# Patient Record
Sex: Male | Born: 1945 | Race: Black or African American | Hispanic: No | Marital: Married | Smoking: Former smoker
Health system: Southern US, Community
[De-identification: ages and names within clinical notes are randomized; demographics above are authoritative.]

## PROBLEM LIST (undated history)

## (undated) DIAGNOSIS — E785 Hyperlipidemia, unspecified: Secondary | ICD-10-CM

## (undated) DIAGNOSIS — R451 Restlessness and agitation: Secondary | ICD-10-CM

## (undated) DIAGNOSIS — C3490 Malignant neoplasm of unspecified part of unspecified bronchus or lung: Secondary | ICD-10-CM

## (undated) DIAGNOSIS — F4310 Post-traumatic stress disorder, unspecified: Secondary | ICD-10-CM

## (undated) HISTORY — PX: COLONOSCOPY: SHX174

---

## 2005-04-03 ENCOUNTER — Ambulatory Visit (HOSPITAL_COMMUNITY): Admission: RE | Admit: 2005-04-03 | Discharge: 2005-04-03 | Payer: Self-pay | Admitting: Gastroenterology

## 2013-09-07 ENCOUNTER — Other Ambulatory Visit: Payer: Self-pay | Admitting: Internal Medicine

## 2013-09-07 ENCOUNTER — Ambulatory Visit
Admission: RE | Admit: 2013-09-07 | Discharge: 2013-09-07 | Disposition: A | Discharge status: Home / Self Care | Payer: Medicare Other | Source: Ambulatory Visit | Attending: Internal Medicine | Admitting: Internal Medicine

## 2013-09-07 DIAGNOSIS — R059 Cough, unspecified: Secondary | ICD-10-CM

## 2013-09-07 DIAGNOSIS — R05 Cough: Secondary | ICD-10-CM

## 2013-09-07 DIAGNOSIS — R918 Other nonspecific abnormal finding of lung field: Secondary | ICD-10-CM

## 2013-09-10 ENCOUNTER — Ambulatory Visit
Admission: RE | Admit: 2013-09-10 | Discharge: 2013-09-10 | Disposition: A | Discharge status: Home / Self Care | Payer: Medicare Other | Source: Ambulatory Visit | Attending: Internal Medicine | Admitting: Internal Medicine

## 2013-09-10 DIAGNOSIS — R059 Cough, unspecified: Secondary | ICD-10-CM

## 2013-09-10 DIAGNOSIS — R05 Cough: Secondary | ICD-10-CM

## 2013-09-10 DIAGNOSIS — R918 Other nonspecific abnormal finding of lung field: Secondary | ICD-10-CM

## 2013-09-10 MED ORDER — IOHEXOL 300 MG/ML  SOLN
75.0000 mL | Freq: Once | INTRAMUSCULAR | Status: AC | PRN
  Administered 2013-09-10: 75 mL via INTRAVENOUS

## 2015-08-14 ENCOUNTER — Inpatient Hospital Stay (HOSPITAL_COMMUNITY): Payer: Medicare Other

## 2015-08-14 ENCOUNTER — Emergency Department (HOSPITAL_COMMUNITY): Payer: Medicare Other

## 2015-08-14 ENCOUNTER — Encounter (HOSPITAL_COMMUNITY): Payer: Self-pay | Admitting: Neurology

## 2015-08-14 ENCOUNTER — Inpatient Hospital Stay (HOSPITAL_COMMUNITY)
Admission: EM | Admit: 2015-08-14 | Discharge: 2015-09-13 | DRG: 208 | Disposition: E | Payer: Medicare Other | Attending: Internal Medicine | Admitting: Internal Medicine

## 2015-08-14 DIAGNOSIS — R569 Unspecified convulsions: Secondary | ICD-10-CM | POA: Diagnosis not present

## 2015-08-14 DIAGNOSIS — R001 Bradycardia, unspecified: Secondary | ICD-10-CM | POA: Diagnosis not present

## 2015-08-14 DIAGNOSIS — Z87891 Personal history of nicotine dependence: Secondary | ICD-10-CM

## 2015-08-14 DIAGNOSIS — C3490 Malignant neoplasm of unspecified part of unspecified bronchus or lung: Secondary | ICD-10-CM | POA: Diagnosis present

## 2015-08-14 DIAGNOSIS — Z515 Encounter for palliative care: Secondary | ICD-10-CM | POA: Diagnosis present

## 2015-08-14 DIAGNOSIS — H0520 Unspecified exophthalmos: Secondary | ICD-10-CM

## 2015-08-14 DIAGNOSIS — G4089 Other seizures: Secondary | ICD-10-CM | POA: Diagnosis present

## 2015-08-14 DIAGNOSIS — I10 Essential (primary) hypertension: Secondary | ICD-10-CM | POA: Diagnosis present

## 2015-08-14 DIAGNOSIS — F4310 Post-traumatic stress disorder, unspecified: Secondary | ICD-10-CM | POA: Diagnosis present

## 2015-08-14 DIAGNOSIS — N179 Acute kidney failure, unspecified: Secondary | ICD-10-CM | POA: Diagnosis present

## 2015-08-14 DIAGNOSIS — J9600 Acute respiratory failure, unspecified whether with hypoxia or hypercapnia: Secondary | ICD-10-CM | POA: Diagnosis present

## 2015-08-14 DIAGNOSIS — J9601 Acute respiratory failure with hypoxia: Secondary | ICD-10-CM | POA: Diagnosis not present

## 2015-08-14 DIAGNOSIS — E869 Volume depletion, unspecified: Secondary | ICD-10-CM | POA: Diagnosis present

## 2015-08-14 DIAGNOSIS — T41295A Adverse effect of other general anesthetics, initial encounter: Secondary | ICD-10-CM | POA: Diagnosis not present

## 2015-08-14 DIAGNOSIS — Z978 Presence of other specified devices: Secondary | ICD-10-CM | POA: Insufficient documentation

## 2015-08-14 DIAGNOSIS — G9340 Encephalopathy, unspecified: Secondary | ICD-10-CM | POA: Diagnosis present

## 2015-08-14 DIAGNOSIS — E870 Hyperosmolality and hypernatremia: Secondary | ICD-10-CM | POA: Diagnosis present

## 2015-08-14 DIAGNOSIS — I639 Cerebral infarction, unspecified: Secondary | ICD-10-CM

## 2015-08-14 DIAGNOSIS — Z66 Do not resuscitate: Secondary | ICD-10-CM | POA: Diagnosis present

## 2015-08-14 DIAGNOSIS — J9690 Respiratory failure, unspecified, unspecified whether with hypoxia or hypercapnia: Secondary | ICD-10-CM | POA: Diagnosis present

## 2015-08-14 DIAGNOSIS — Z4659 Encounter for fitting and adjustment of other gastrointestinal appliance and device: Secondary | ICD-10-CM | POA: Insufficient documentation

## 2015-08-14 DIAGNOSIS — C7949 Secondary malignant neoplasm of other parts of nervous system: Secondary | ICD-10-CM | POA: Diagnosis not present

## 2015-08-14 DIAGNOSIS — E876 Hypokalemia: Secondary | ICD-10-CM | POA: Diagnosis present

## 2015-08-14 DIAGNOSIS — E785 Hyperlipidemia, unspecified: Secondary | ICD-10-CM | POA: Diagnosis present

## 2015-08-14 DIAGNOSIS — R404 Transient alteration of awareness: Secondary | ICD-10-CM | POA: Diagnosis not present

## 2015-08-14 DIAGNOSIS — R9402 Abnormal brain scan: Secondary | ICD-10-CM | POA: Diagnosis not present

## 2015-08-14 DIAGNOSIS — C7931 Secondary malignant neoplasm of brain: Secondary | ICD-10-CM | POA: Diagnosis present

## 2015-08-14 DIAGNOSIS — Z789 Other specified health status: Secondary | ICD-10-CM | POA: Diagnosis not present

## 2015-08-14 DIAGNOSIS — G40301 Generalized idiopathic epilepsy and epileptic syndromes, not intractable, with status epilepticus: Secondary | ICD-10-CM | POA: Diagnosis not present

## 2015-08-14 DIAGNOSIS — R451 Restlessness and agitation: Secondary | ICD-10-CM | POA: Diagnosis not present

## 2015-08-14 HISTORY — DX: Restlessness and agitation: R45.1

## 2015-08-14 HISTORY — DX: Hyperlipidemia, unspecified: E78.5

## 2015-08-14 HISTORY — DX: Post-traumatic stress disorder, unspecified: F43.10

## 2015-08-14 HISTORY — DX: Malignant neoplasm of unspecified part of unspecified bronchus or lung: C34.90

## 2015-08-14 LAB — CBC
HCT: 42 % (ref 39.0–52.0)
HEMOGLOBIN: 14 g/dL (ref 13.0–17.0)
MCH: 28 pg (ref 26.0–34.0)
MCHC: 33.3 g/dL (ref 30.0–36.0)
MCV: 84 fL (ref 78.0–100.0)
PLATELETS: 233 10*3/uL (ref 150–400)
RBC: 5 MIL/uL (ref 4.22–5.81)
RDW: 15.3 % (ref 11.5–15.5)
WBC: 8.8 10*3/uL (ref 4.0–10.5)

## 2015-08-14 LAB — BLOOD GAS, ARTERIAL
ACID-BASE EXCESS: 2.1 mmol/L — AB (ref 0.0–2.0)
BICARBONATE: 26 meq/L — AB (ref 20.0–24.0)
DRAWN BY: 39899
FIO2: 1
LHR: 14 {breaths}/min
O2 SAT: 99.6 %
PEEP/CPAP: 5 cmH2O
PH ART: 7.438 (ref 7.350–7.450)
Patient temperature: 98.6
TCO2: 27.2 mmol/L (ref 0–100)
VT: 650 mL
pCO2 arterial: 39 mmHg (ref 35.0–45.0)
pO2, Arterial: 440 mmHg — ABNORMAL HIGH (ref 80.0–100.0)

## 2015-08-14 LAB — MRSA PCR SCREENING: MRSA by PCR: NEGATIVE

## 2015-08-14 LAB — URINE MICROSCOPIC-ADD ON

## 2015-08-14 LAB — I-STAT CHEM 8, ED
BUN: 13 mg/dL (ref 6–20)
CHLORIDE: 102 mmol/L (ref 101–111)
CREATININE: 1.3 mg/dL — AB (ref 0.61–1.24)
Calcium, Ion: 1.07 mmol/L — ABNORMAL LOW (ref 1.13–1.30)
Glucose, Bld: 158 mg/dL — ABNORMAL HIGH (ref 65–99)
HEMATOCRIT: 48 % (ref 39.0–52.0)
Hemoglobin: 16.3 g/dL (ref 13.0–17.0)
Potassium: 3.3 mmol/L — ABNORMAL LOW (ref 3.5–5.1)
SODIUM: 143 mmol/L (ref 135–145)
TCO2: 26 mmol/L (ref 0–100)

## 2015-08-14 LAB — DIFFERENTIAL
BASOS ABS: 0 10*3/uL (ref 0.0–0.1)
Basophils Relative: 0 %
EOS PCT: 6 %
Eosinophils Absolute: 0.5 10*3/uL (ref 0.0–0.7)
LYMPHS ABS: 1.7 10*3/uL (ref 0.7–4.0)
LYMPHS PCT: 19 %
Monocytes Absolute: 0.5 10*3/uL (ref 0.1–1.0)
Monocytes Relative: 6 %
NEUTROS PCT: 69 %
Neutro Abs: 6.1 10*3/uL (ref 1.7–7.7)

## 2015-08-14 LAB — URINALYSIS, ROUTINE W REFLEX MICROSCOPIC
BILIRUBIN URINE: NEGATIVE
Glucose, UA: NEGATIVE mg/dL
HGB URINE DIPSTICK: NEGATIVE
Ketones, ur: NEGATIVE mg/dL
Nitrite: NEGATIVE
PH: 7 (ref 5.0–8.0)
Protein, ur: NEGATIVE mg/dL
SPECIFIC GRAVITY, URINE: 1.016 (ref 1.005–1.030)

## 2015-08-14 LAB — PROTIME-INR
INR: 1.17 (ref 0.00–1.49)
PROTHROMBIN TIME: 15.1 s (ref 11.6–15.2)

## 2015-08-14 LAB — COMPREHENSIVE METABOLIC PANEL
ALBUMIN: 3.8 g/dL (ref 3.5–5.0)
ALK PHOS: 66 U/L (ref 38–126)
ALT: 26 U/L (ref 17–63)
ANION GAP: 15 (ref 5–15)
AST: 29 U/L (ref 15–41)
BILIRUBIN TOTAL: 0.7 mg/dL (ref 0.3–1.2)
BUN: 11 mg/dL (ref 6–20)
CALCIUM: 9.1 mg/dL (ref 8.9–10.3)
CO2: 24 mmol/L (ref 22–32)
Chloride: 103 mmol/L (ref 101–111)
Creatinine, Ser: 1.56 mg/dL — ABNORMAL HIGH (ref 0.61–1.24)
GFR calc Af Amer: 51 mL/min — ABNORMAL LOW (ref >60)
GFR calc non Af Amer: 44 mL/min — ABNORMAL LOW (ref >60)
GLUCOSE: 164 mg/dL — AB (ref 65–99)
POTASSIUM: 3.5 mmol/L (ref 3.5–5.1)
SODIUM: 142 mmol/L (ref 135–145)
TOTAL PROTEIN: 7.5 g/dL (ref 6.5–8.1)

## 2015-08-14 LAB — I-STAT TROPONIN, ED: Troponin i, poc: 0 ng/mL (ref 0.00–0.08)

## 2015-08-14 LAB — TSH: TSH: 1.728 u[IU]/mL (ref 0.350–4.500)

## 2015-08-14 LAB — TRIGLYCERIDES
Triglycerides: 90 mg/dL (ref <150)
Triglycerides: 86 mg/dL (ref <150)

## 2015-08-14 LAB — APTT: APTT: 34 s (ref 24–37)

## 2015-08-14 LAB — GLUCOSE, CAPILLARY: Glucose-Capillary: 110 mg/dL — ABNORMAL HIGH (ref 65–99)

## 2015-08-14 MED ORDER — CHLORHEXIDINE GLUCONATE 0.12% ORAL RINSE (MEDLINE KIT)
15.0000 mL | Freq: Two times a day (BID) | OROMUCOSAL | Status: DC
Start: 2015-08-14 — End: 2015-08-17
  Administered 2015-08-14 – 2015-08-17 (×4): 15 mL via OROMUCOSAL

## 2015-08-14 MED ORDER — FAMOTIDINE IN NACL 20-0.9 MG/50ML-% IV SOLN
20.0000 mg | Freq: Two times a day (BID) | INTRAVENOUS | Status: DC
  Administered 2015-08-14 – 2015-08-18 (×8): 20 mg via INTRAVENOUS
  Filled 2015-08-14 (×9): qty 50

## 2015-08-14 MED ORDER — FENTANYL CITRATE (PF) 100 MCG/2ML IJ SOLN
100.0000 ug | INTRAMUSCULAR | Status: DC | PRN
  Administered 2015-08-14 (×2): 50 ug via INTRAVENOUS
  Administered 2015-08-15 – 2015-08-17 (×4): 100 ug via INTRAVENOUS
  Filled 2015-08-14 (×6): qty 2

## 2015-08-14 MED ORDER — PROPOFOL 1000 MG/100ML IV EMUL
INTRAVENOUS | Status: AC
  Filled 2015-08-14: qty 100

## 2015-08-14 MED ORDER — ETOMIDATE 2 MG/ML IV SOLN: 20.0000 mg | Freq: Once | INTRAVENOUS | Status: DC

## 2015-08-14 MED ORDER — SODIUM CHLORIDE 0.9 % IV SOLN
1000.0000 mg | Freq: Two times a day (BID) | INTRAVENOUS | Status: DC
  Administered 2015-08-14 – 2015-08-16 (×4): 1000 mg via INTRAVENOUS
  Filled 2015-08-14 (×6): qty 10

## 2015-08-14 MED ORDER — DEXAMETHASONE SODIUM PHOSPHATE 10 MG/ML IJ SOLN
10.0000 mg | Freq: Once | INTRAMUSCULAR | Status: AC
  Administered 2015-08-14: 10 mg via INTRAVENOUS
  Filled 2015-08-14: qty 1

## 2015-08-14 MED ORDER — ONDANSETRON HCL 4 MG/2ML IJ SOLN
4.0000 mg | Freq: Four times a day (QID) | INTRAMUSCULAR | Status: DC | PRN
Start: 2015-08-14 — End: 2015-08-20

## 2015-08-14 MED ORDER — PROPOFOL 1000 MG/100ML IV EMUL
5.0000 ug/kg/min | INTRAVENOUS | Status: DC
  Administered 2015-08-14: 50 ug/kg/min via INTRAVENOUS
  Administered 2015-08-14 – 2015-08-15 (×2): 30 ug/kg/min via INTRAVENOUS
  Administered 2015-08-15: 35 ug/kg/min via INTRAVENOUS
  Administered 2015-08-15: 30 ug/kg/min via INTRAVENOUS
  Administered 2015-08-15: 40 ug/kg/min via INTRAVENOUS
  Administered 2015-08-16: 35 ug/kg/min via INTRAVENOUS
  Filled 2015-08-14 (×11): qty 100

## 2015-08-14 MED ORDER — ENOXAPARIN SODIUM 40 MG/0.4ML ~~LOC~~ SOLN
40.0000 mg | SUBCUTANEOUS | Status: DC
  Administered 2015-08-14 – 2015-08-17 (×4): 40 mg via SUBCUTANEOUS
  Filled 2015-08-14 (×5): qty 0.4

## 2015-08-14 MED ORDER — DOCUSATE SODIUM 50 MG/5ML PO LIQD: 100.0000 mg | Freq: Two times a day (BID) | ORAL | Status: DC | PRN

## 2015-08-14 MED ORDER — ACETAMINOPHEN 325 MG PO TABS: 650.0000 mg | ORAL_TABLET | ORAL | Status: DC | PRN

## 2015-08-14 MED ORDER — SODIUM CHLORIDE 0.9 % IV SOLN
INTRAVENOUS | Status: DC
  Administered 2015-08-14 – 2015-08-16 (×2): via INTRAVENOUS

## 2015-08-14 MED ORDER — ANTISEPTIC ORAL RINSE SOLUTION (CORINZ)
7.0000 mL | Freq: Four times a day (QID) | OROMUCOSAL | Status: DC
  Administered 2015-08-15 – 2015-08-17 (×8): 7 mL via OROMUCOSAL

## 2015-08-14 MED ORDER — ANTISEPTIC ORAL RINSE SOLUTION (CORINZ)
7.0000 mL | Freq: Four times a day (QID) | OROMUCOSAL | Status: DC
  Administered 2015-08-15 – 2015-08-16 (×2): 7 mL via OROMUCOSAL

## 2015-08-14 MED ORDER — DEXAMETHASONE SODIUM PHOSPHATE 4 MG/ML IJ SOLN
4.0000 mg | Freq: Four times a day (QID) | INTRAMUSCULAR | Status: DC
  Administered 2015-08-15 (×2): 4 mg via INTRAVENOUS
  Filled 2015-08-14 (×5): qty 1

## 2015-08-14 MED ORDER — ALBUTEROL SULFATE (2.5 MG/3ML) 0.083% IN NEBU: 2.5000 mg | INHALATION_SOLUTION | RESPIRATORY_TRACT | Status: DC | PRN

## 2015-08-14 MED ORDER — IOHEXOL 350 MG/ML SOLN: 50.0000 mL | Freq: Once | INTRAVENOUS | Status: DC | PRN

## 2015-08-14 MED ORDER — FENTANYL CITRATE (PF) 100 MCG/2ML IJ SOLN
INTRAMUSCULAR | Status: AC
  Administered 2015-08-14: 50 ug
  Filled 2015-08-14: qty 2

## 2015-08-14 MED ORDER — CHLORHEXIDINE GLUCONATE 0.12% ORAL RINSE (MEDLINE KIT)
15.0000 mL | Freq: Two times a day (BID) | OROMUCOSAL | Status: DC
  Administered 2015-08-15 – 2015-08-17 (×3): 15 mL via OROMUCOSAL

## 2015-08-14 MED ORDER — ETOMIDATE 2 MG/ML IV SOLN
INTRAVENOUS | Status: DC | PRN
  Administered 2015-08-14: 20 mg via INTRAVENOUS

## 2015-08-14 MED ORDER — ROCURONIUM BROMIDE 50 MG/5ML IV SOLN
INTRAVENOUS | Status: DC | PRN
  Administered 2015-08-14: 100 mg via INTRAVENOUS

## 2015-08-14 MED ORDER — ROCURONIUM BROMIDE 50 MG/5ML IV SOLN: 1.0000 mg/kg | Freq: Once | INTRAVENOUS | Status: DC

## 2015-08-14 MED ORDER — SODIUM CHLORIDE 0.9 % IV SOLN: 250.0000 mL | INTRAVENOUS | Status: DC | PRN

## 2015-08-14 MED ORDER — FENTANYL CITRATE (PF) 100 MCG/2ML IJ SOLN
100.0000 ug | INTRAMUSCULAR | Status: DC | PRN
  Administered 2015-08-15: 100 ug via INTRAVENOUS

## 2015-08-14 MED ORDER — GADOBENATE DIMEGLUMINE 529 MG/ML IV SOLN
20.0000 mL | Freq: Once | INTRAVENOUS | Status: AC | PRN
  Administered 2015-08-14: 20 mL via INTRAVENOUS

## 2015-08-14 MED ORDER — PROPOFOL 1000 MG/100ML IV EMUL
5.0000 ug/kg/min | INTRAVENOUS | Status: DC
  Administered 2015-08-14: 14:00:00 via INTRAVENOUS

## 2015-08-14 NOTE — H&P (Addendum)
PULMONARY / CRITICAL CARE MEDICINE   Name: Nathan Wolf MRN: [id number] DOB: 07-13-1946  09/04/2015  ADMISSION DATE:  09/09/2015 CONSULTATION DATE:  09/04/2015  REFERRING MD:  ER physician, [place]  CHIEF COMPLAINT:  Confusion, found down  HISTORY OF PRESENT ILLNESS:   This is a 70 year old male who by report has a history of lung cancer who was brought to the [facility] emergency department on 08/31/2015 after he was found unresponsive by his family. The patient was encephalopathic on the time of my arrival and could not provide history so history was provided by chart review. By report, he was acting "funny" last night by his family. He was last seen ambulating and interactive before bedtime on 08/12/2014. He was found early in the morning on 08/28/2015 with loud breathing after a presumed fall. He was unresponsive and brought to the [facility] emergency department. Apparently he became combative but was unable to move his right side. He required Versed 5 mg by EMS on transport to [facility].  His wife notes that he is very reticent about his cancer treatment.  His son believes he has stage IV disease, but they don't know any of the details.  They believe that he was recently started on a new pill for his cancer and was recently given "a shot" for the cancer as well.   PAST MEDICAL HISTORY :  He  has a past medical history of PTSD (post-traumatic stress disorder); Hyperlipidemia; Agitation; and Lung cancer ([place]).  PAST SURGICAL HISTORY: He  has no past surgical history on file.  No Known Allergies  No current facility-administered medications on file prior to encounter.   No current outpatient prescriptions on file prior to encounter.    FAMILY HISTORY:  His has no family status information on file.   SOCIAL HISTORY: He    REVIEW OF SYSTEMS:   Cannot obtain due to intubation and encephalopathy  SUBJECTIVE:  As above  VITAL SIGNS: BP 146/95 mmHg  Pulse 97   Temp(Src) 95.2 F (35.1 C)  Resp 16  Wt 243 lb 9.7 oz (110.5 kg)  SpO2 100%  HEMODYNAMICS:    VENTILATOR SETTINGS:    INTAKE / OUTPUT:    PHYSICAL EXAMINATION: General:  Sedated on vent, comfortable Neuro:  Sedated but arouses, moves all 4 extremities, does not follow commands HEENT:  Normocephalic atraumatic, ET tube in place, bilateral exophthalmos noted Cardiovascular:  Regular rate and rhythm, no murmurs gallops or rubs Lungs:  Clear to auscultation bilaterally, vent supported breaths Abdomen:  Bowel sounds positive, nontender nondistended Musculoskeletal:  Normal bulk and tone Skin:  No rash and skin breakdown  LABS:  BMET  Recent Labs Lab 08/23/2015 1331 09/05/2015 1336  NA 142 143  K 3.5 3.3*  CL 103 102  CO2 24  --   BUN 11 13  CREATININE 1.56* 1.30*  GLUCOSE 164* 158*    Electrolytes  Recent Labs Lab 08/25/2015 1331  CALCIUM 9.1    CBC  Recent Labs Lab 09/09/2015 1331 08/25/2015 1336  WBC 8.8  --   HGB 14.0 16.3  HCT 42.0 48.0  PLT 233  --     Coag's  Recent Labs Lab 08/16/2015 1331  APTT 34  INR 1.17    Sepsis Markers No results for input(s): LATICACIDVEN, PROCALCITON, O2SATVEN in the last 168 hours.  ABG No results for input(s): PHART, PCO2ART, PO2ART in the last 168 hours.  Liver Enzymes  Recent Labs Lab 08/30/2015 1331  AST 29  ALT  26  ALKPHOS 66  BILITOT 0.7  ALBUMIN 3.8    Cardiac Enzymes No results for input(s): TROPONINI, PROBNP in the last 168 hours.  Glucose No results for input(s): GLUCAP in the last 168 hours.  Imaging Ct Head Wo Contrast  08/23/2015  ADDENDUM REPORT: 09/07/2015 13:58 ADDENDUM: These results were called by telephone at the time of interpretation on 08/16/2015 at 1:50 pm to Dr. Purnell Shoemaker, who verbally acknowledged these results. Electronically Signed   By: Vinnie Langton M.D.   On: 08/23/2015 13:58  09/10/2015  CLINICAL DATA:  70 year old male with right-sided weakness. Combative behavior. Code  stroke. EXAM: CT HEAD WITHOUT CONTRAST TECHNIQUE: Contiguous axial images were obtained from the base of the skull through the vertex without intravenous contrast. COMPARISON:  No priors. FINDINGS: No acute intracranial abnormalities. Specifically, no evidence of acute intracranial hemorrhage, no definite findings of acute/subacute cerebral ischemia, no mass, mass effect, hydrocephalus or abnormal intra or extra-axial fluid collections. Visualized paranasal sinuses and mastoids are well pneumatized. No acute displaced skull fractures are identified. IMPRESSION: *No acute intracranial abnormalities. *The appearance of the brain is normal. Electronically Signed: By: Vinnie Langton M.D. On: 08/29/2015 13:42   09/01/2015 CXR > diffuse interstitial opacities, ETT in place  STUDIES:  CT head January 2: No acute intracranial abnormalities MRI brain January 2: Preliminary read no acute intracranial abnormality, no stroke  CULTURES: None  ANTIBIOTICS: None  SIGNIFICANT EVENTS: January 2 admitted to [facility]  LINES/TUBES: January 2 endotracheal tube  DISCUSSION: This is a 70 year old male with largely unknown past medical history who was admitted to [facility] on 09/02/2015 in a post ictal state after a presumed first lifetime seizure. He was encephalopathic upon arrival to [facility] but notably, this is in the setting of having received 5 mg of Versed for combativeness. By report, his family saw him "seizing" at home.  ASSESSMENT / PLAN:  NEUROLOGIC A:   New onset seizure, reportedly started a new cancer pill Acute encephalopathy > likely post ictal High risk metastatic lesion given history of lung cancer P:   RASS goal: 0 Keppra per neurology Propofol gtt Fentanyl intermittent PAD protocol Obtain records from the [place] [facility] > what med was he given? What is baseline MRI brain like?  PULMONARY A: Acute respiratory failure due to inability to  protect airway History of lung cancer Abnormal CXR > diffuse lymphangitic spread from malignancy?, likely adenocarcinoma/BAC based on imaging P:   Full ventilator support for now Ventilator associated pneumonia prevention protocol Chest x-ray in a.m. Follow-up ABG Need to track down lung cancer treatment records  CARDIOVASCULAR A:  No acute issues  P:  Telemetry monitoring Gentle IV fluids  RENAL A:   Hypokalemia P:   Replace potassium Monitor BMET and UOP Replace electrolytes as needed  GASTROINTESTINAL A:   No acute issues P:   Continue stress ulcer prophylaxis with H2 blocker Consider Enteral tube feeding  HEMATOLOGIC A:   No acute issues P:  Monitor for bleeding  INFECTIOUS A:   No acute issues P:   Monitor for bleeding  ENDOCRINE A:   Exopthalamos   P:   Check TSH    FAMILY  - Updates: Wife updated bedside 1/2  - Inter-disciplinary family meet or Palliative Care meeting due by:  Day 7   My cc time 45 minutes  Roselie Awkward, MD [facility] Pager: [phone] Cell: [phone] After 3pm or if no response, call [phone]  09/12/2015'@5'$ :36 PM

## 2015-08-14 NOTE — Consult Note (Signed)
Requesting Physician: Dr. Regenia Skeeter    Chief Complaint: AMS and proptosis  HPI:                                                                                                                                         GRAYSYN BACHE is an 70 y.o. male who was last seen normal last night. This AM he was in his bedroom and had not come down as usual.  He was heard to fall in his bedroom and then groining. Family went to his bedside and noted he was very combative. There is mention of right sided jerking but none noted when EMS arrived. Currently he has had 5 mg Versed for agitation and now obtunded. He has right sided weakness noted with painful stimuli and significant bilateral proptosis. LSN was 1400 hours 08/13/2015.   Date last known well: 08/13/2015 Time last known well: Time: 14:00 tPA Given: No: out of window     Past Medical History  Diagnosis Date  . PTSD (post-traumatic stress disorder)   . Hyperlipidemia   . Agitation   . Lung cancer ([place])     No past surgical history on file.  Family History  Problem Relation Age of Onset  . Hypertension Mother   . Hypertension Father   . Hyperlipidemia Mother   . Hyperlipidemia Father    Social History:  has no tobacco, alcohol, and drug history on file.  Allergies: No Known Allergies  Medications:                                                                                                                           Current Facility-Administered Medications  Medication Dose Route Frequency Provider Last Rate Last Dose  . etomidate (AMIDATE) injection 20 mg  20 mg Intravenous Once Sherwood Gambler, MD      . iohexol (OMNIPAQUE) 350 MG/ML injection 50 mL  50 mL Intravenous Once PRN Damyiah Moxley Fuller Mandril, MD      . propofol (DIPRIVAN) 1000 MG/100ML infusion  5-80 mcg/kg/min Intravenous Titrated Sherwood Gambler, MD      . rocuronium (ZEMURON) injection 1 mg/kg  1 mg/kg Intravenous Once Sherwood Gambler, MD       No current outpatient  prescriptions on file.    Current facility-administered medications:  .  0.9 %  sodium chloride infusion, 250 mL, Intravenous, PRN, Nathaneil Canary B  McQuaid, MD .  0.9 %  sodium chloride infusion, , Intravenous, Continuous, Juanito Doom, MD, Last Rate: 50 mL/hr at 09/06/2015 1930 .  acetaminophen (TYLENOL) tablet 650 mg, 650 mg, Oral, Q4H PRN, Juanito Doom, MD .  albuterol (PROVENTIL) (2.5 MG/3ML) 0.083% nebulizer solution 2.5 mg, 2.5 mg, Nebulization, Q2H PRN, Juanito Doom, MD .  Derrill Memo ON 08/15/2015] antiseptic oral rinse solution (CORINZ), 7 mL, Mouth Rinse, QID, Juanito Doom, MD .  chlorhexidine gluconate (PERIDEX) 0.12 % solution 15 mL, 15 mL, Mouth Rinse, BID, Juanito Doom, MD, 15 mL at 08/30/2015 2000 .  enoxaparin (LOVENOX) injection 40 mg, 40 mg, Subcutaneous, Q24H, Juanito Doom, MD .  etomidate (AMIDATE) injection 20 mg, 20 mg, Intravenous, Once, Sherwood Gambler, MD, 20 mg at 08/28/2015 1408 .  etomidate (AMIDATE) injection, , Intravenous, PRN, Sherwood Gambler, MD, 20 mg at 09/10/2015 1401 .  famotidine (PEPCID) IVPB 20 mg premix, 20 mg, Intravenous, Q12H, Juanito Doom, MD .  fentaNYL (SUBLIMAZE) injection 100 mcg, 100 mcg, Intravenous, Q15 min PRN, Juanito Doom, MD .  fentaNYL (SUBLIMAZE) injection 100 mcg, 100 mcg, Intravenous, Q2H PRN, Juanito Doom, MD, 50 mcg at 08/30/2015 1930 .  iohexol (OMNIPAQUE) 350 MG/ML injection 50 mL, 50 mL, Intravenous, Once PRN, Byanca Kasper Fuller Mandril, MD .  levETIRAcetam (KEPPRA) 1,000 mg in sodium chloride 0.9 % 100 mL IVPB, 1,000 mg, Intravenous, Q12H, Nargis Abrams Fuller Mandril, MD, 1,000 mg at 08/16/2015 1640 .  ondansetron (ZOFRAN) injection 4 mg, 4 mg, Intravenous, Q6H PRN, Juanito Doom, MD .  propofol (DIPRIVAN) 1000 MG/100ML infusion, 5-80 mcg/kg/min, Intravenous, Titrated, Sherwood Gambler, MD, Last Rate: 29.8 mL/hr at 08/27/2015 1859, 45 mcg/kg/min at 08/17/2015 1859 .  rocuronium (ZEMURON) injection, , Intravenous,  PRN, Sherwood Gambler, MD, 100 mg at 08/30/2015 1401   ROS:                                                                                                                                       History obtained from unobtainable from patient due to obtunded secondary to versed.    Neurologic Examination:                                                                                                      There were no vitals taken for this visit.  HEENT-  Normocephalic, no lesions, without obvious abnormality.  Normal external eye and conjunctiva.  Normal TM's bilaterally.  Normal auditory canals and  external ears. Normal external nose, mucus membranes and septum.  Normal pharynx. Cardiovascular- S1, S2 normal, pulses palpable throughout   Lungs- chest clear, no wheezing, rales, normal symmetric air entry Abdomen- normal findings: bowel sounds normal Extremities- no clubbing Lymph-no adenopathy palpable Musculoskeletal-no joint tenderness, deformity or swelling Skin-warm and dry, no hyperpigmentation, vitiligo, or suspicious lesions  Neurological Examination Mental Status: Obtunded with 5 of Versed.  Non vocal.  Not following commands and withdrawals from pain. Cranial Nerves: II: No blink to threat, pupils equal and minimal sluggishly reactive bilaterally, round, no blink to threat III,IV, VI: Corneal reflex intact, weaker on the right, with significant proptosis bilaterally V,VII: face symmetric VIII: unable to obtain IX,X: unable to obtain  Motor: Right : Upper extremity  minimal withdrawal     Left:     Upper extremity    move spontaneously, sustain antigravity noted  Lower extremity  minimal withdrawal     Lower extremity  easily withdraws to light tactile stimulus --moving left greater than right Tone and bulk:normal tone throughout; no atrophy noted Sensory: withdraws from tactile stimulus  bilaterally L>R Deep Tendon Reflexes: 2+ and symmetric throughout --more brisk on the  left Plantars: Mute bilaterally Cerebellar: Unable to obtain Gait: not tested    Lab Results: Basic Metabolic Panel:  Recent Labs Lab 09/09/2015 1336  NA 143  K 3.3*  CL 102  GLUCOSE 158*  BUN 13  CREATININE 1.30*    Liver Function Tests: No results for input(s): AST, ALT, ALKPHOS, BILITOT, PROT, ALBUMIN in the last 168 hours. No results for input(s): LIPASE, AMYLASE in the last 168 hours. No results for input(s): AMMONIA in the last 168 hours.  CBC:  Recent Labs Lab 08/26/2015 1331 08/24/2015 1336  WBC 8.8  --   NEUTROABS 6.1  --   HGB 14.0 16.3  HCT 42.0 48.0  MCV 84.0  --   PLT 233  --     Cardiac Enzymes: No results for input(s): CKTOTAL, CKMB, CKMBINDEX, TROPONINI in the last 168 hours.  Lipid Panel: No results for input(s): CHOL, TRIG, HDL, CHOLHDL, VLDL, LDLCALC in the last 168 hours.  CBG: No results for input(s): GLUCAP in the last 168 hours.  Microbiology: No results found for this or any previous visit.  Coagulation Studies:  Recent Labs  09/08/2015 1331  LABPROT 15.1  INR 1.17    Imaging: Ct Head Wo Contrast  09/05/2015  CLINICAL DATA:  70 year old male with right-sided weakness. Combative behavior. Code stroke. EXAM: CT HEAD WITHOUT CONTRAST TECHNIQUE: Contiguous axial images were obtained from the base of the skull through the vertex without intravenous contrast. COMPARISON:  No priors. FINDINGS: No acute intracranial abnormalities. Specifically, no evidence of acute intracranial hemorrhage, no definite findings of acute/subacute cerebral ischemia, no mass, mass effect, hydrocephalus or abnormal intra or extra-axial fluid collections. Visualized paranasal sinuses and mastoids are well pneumatized. No acute displaced skull fractures are identified. IMPRESSION: *No acute intracranial abnormalities. *The appearance of the brain is normal. Electronically Signed   By: Vinnie Langton M.D.   On: 09/10/2015 13:42      Assessment: 70 y.o. male patient  with a history of lung malignancy, followed at the [facility]. Patient unable to provide history due to altered mental status. Information obtained from his wife. His wife is not aware about the staging of his malignancy or the treatments is going through. He is noted to have some dizziness and altered awareness symptoms starting yesterday afternoon at 2 PM and this morning  his family noted convulsive activity involving his right upper and lower extremities's cyst of seizure. Evaluation in the emergency room showed weakness in his right upper lower studies which is most likely a postictal Todd's paralysis from the described seizure. He is also noted to have significant proptosis bilaterally, but his wife reported that he had as proptosis for most of his life and is not new.  Initial CT of the brain done in the ER did not show any acute pathology. To protect his airway, he is intubated in the ER due to worsening respiratory distress. Keppra 1 g twice a days been ordered for seizures. No further seizure activity is noted since arrival in the ER MRI of the brain is done along with MRI of the orbits, both with and without contrast. It showed Diffuse leptomeningeal enhancement. Additionally, small scattered parenchymal enhancing lesions which may be partially hemorrhagic or proteinaceous also noted suggesting the presence of diffuse metastatic disease given the patient's history lung cancer. In the proper clinical setting, infection or sarcoidosis could not be excluded as cause of these findings. Diffuse thickening and enhancement of the infundibulum. This is suspicious for involvement by metastatic disease. Will start Decadron with initial dose of 10 mg followed by 4 mg every 6 hours. Plan for lumbar puncture for further CSF analysis and cytology tomorrow morning. Also recommend obtaining records about his primary lung malignancy and treatment from the [facility] in [facility] Will follow up.

## 2015-08-14 NOTE — Progress Notes (Signed)
EEG completed, results pending. 

## 2015-08-14 NOTE — ED Notes (Signed)
MD at bedside. 

## 2015-08-14 NOTE — Procedures (Signed)
ELECTROENCEPHALOGRAM REPORT   Patient: Nathan Wolf        Age: 70 y.o.        Sex: male Referring Physician: Dr Lake Bells Report Date:  09/02/2015        Interpreting Physician: Hulen Luster  History: Nathan Wolf is an 70 y.o. male with brain mets, with AMS and possible seizure activity  Medications:  On propofol   Conditions of Recording:  This is a 16 channel EEG carried out with the patient in the intubated and sedated state.  Description:  There is profound suppression of the background rhythm with occasional theta activity seen. No posterior dominant alpha rhythm is noted. No focal slowing or epileptiform activity noted.  Hyperventilation was not performed. Intermittent photic stimulation was not performed.   IMPRESSION: Abnormal EEG due to the presence of a generalized slowing and suppression of the background rhythm. This is likely a medication effect. No epileptiform activity noted.    Jim Like, DO [facility] [phone]  If 7pm- 7am, please page neurology on call as listed in AMION. 08/25/2015, 5:59 PM

## 2015-08-14 NOTE — ED Notes (Signed)
Patient transported to MRI 

## 2015-08-14 NOTE — ED Notes (Signed)
75mg Diprivan bolus given for movement during MRI

## 2015-08-14 NOTE — Code Documentation (Signed)
Code [place] [employer] by EMS at [facility].  Last known well actually yesterday sometime.  Patient has been intermittently agitated since yesterday.  Family called EMS due to severe agitation and right side weakness.  Upon arrival to the hospital patient is obtunded, He received 5 Midazolam IM per EMS.  Stat labs and head CT done.  After CT patient spontaneously moving Left arm to stimulation but right arm flaccid, and minimally moving leg. Mute.Bulging eyes, baseline per family (red/irritated today).  Decision made by neurologist and EDP to electively intubate patient prior to MRI.  Patient intubated by EDP, and sedated with diprivan.  Family at bedside.  Patient transported to MRI with vent and heart monitor.  Post MRI patient transported to 2M09, Rn at bedside

## 2015-08-14 NOTE — ED Notes (Signed)
Family at bedside. 

## 2015-08-14 NOTE — ED Provider Notes (Signed)
CSN: [id number]     Arrival date & time 08/13/2015  1327 History   First MD Initiated Contact with Patient 09/07/2015 1333     No chief complaint on file.    (Consider location/radiation/quality/duration/timing/severity/associated sxs/prior Treatment) HPI  70 year old male presents with acute altered mental status. History is taken from EMS as the patient is currently sedated. According to them, patient was acting odd last night and woke up not acting quite right today. Family heard him go upstairs and then heard him breathing hard and found him shaking with his right side. After this he seemed to be confused and agitated. EMS had a hard time getting any vital signs or workup started and given how agitated was he was given IM Versed. Since then he has become sedated and more cooperative. Patient is currently asleep and snoring and thus history is unobtainable.  After patient's workup had been started and after patient was intubated, family arrived gives more detailed history. According to the wife the patient was acting "funny" last night. She did not see him today she woke up before him. She heard him breathing very loudly upstairs, went upstairs, and found him having a right-sided seizure. His eyes were bulging and becoming quite red. He was then acting agitated when EMS arrived. Of note, he has proptosis bilaterally, the wife states that his eyes have always been big but she is unsure if they have bulged outward. Patient has a history of lung cancer. She knows he is being treated in [place] but she is not sure what treatment he is on. No prior history of seizures.  No past medical history on file. No past surgical history on file. No family history on file. Social History  Substance Use Topics  . Smoking status: Not on file  . Smokeless tobacco: Not on file  . Alcohol Use: Not on file    Review of Systems  Unable to perform ROS: Mental status change      Allergies  Review of patient's  allergies indicates no known allergies.  Home Medications   Prior to Admission medications   Not on File   There were no vitals taken for this visit. Physical Exam  Constitutional: He appears well-developed and well-nourished.  HENT:  Head: Normocephalic and atraumatic.  Right Ear: External ear normal.  Left Ear: External ear normal.  Nose: Nose normal.  Eyes: Right eye exhibits no discharge. Left eye exhibits no discharge. Right conjunctiva is injected. Left conjunctiva is injected.  Bilateral proptosis. Small pupils that are minimal reactive. Conjunctival injection.  Neck: Neck supple.  Cardiovascular: Normal rate, regular rhythm, normal heart sounds and intact distal pulses.   Pulmonary/Chest:  Coarse breath sounds. Snoring respirations  Abdominal: Soft. There is no tenderness.  Musculoskeletal: He exhibits no edema.  Neurological: He is unresponsive. GCS eye subscore is 2. GCS verbal subscore is 2. GCS motor subscore is 5.  Lethargic, opens eyes to pain but makes incomprehensible noises. Right arm is flaccid. Left arm moves spontaneously, left leg and right leg with minimal movement to pain.  Skin: Skin is warm and dry.  Nursing note and vitals reviewed.   ED Course  .Intubation Date/Time: 08/15/2015 2:06 PM Performed by: Sherwood Gambler Authorized by: Sherwood Gambler Consent: The procedure was performed in an emergent situation. Patient identity confirmed: hospital-assigned identification number and arm band Indications: respiratory failure and  airway protection Intubation method: video-assisted Patient status: paralyzed (RSI) Preoxygenation: BVM Sedatives: etomidate Paralytic: rocuronium Tube size: 8.0 mm Tube type: cuffed  Number of attempts: 1 Cords visualized: yes Post-procedure assessment: chest rise and CO2 detector Breath sounds: equal Cuff inflated: yes ETT to lip: 24 cm Tube secured with: ETT holder Chest x-ray interpreted by me and radiologist. Chest  x-ray findings: endotracheal tube in appropriate position Patient tolerance: Patient tolerated the procedure well with no immediate complications   (including critical care time) Labs Review Labs Reviewed  COMPREHENSIVE METABOLIC PANEL - Abnormal; Notable for the following:    Glucose, Bld 164 (*)    Creatinine, Ser 1.56 (*)    GFR calc non Af Amer 44 (*)    GFR calc Af Amer 51 (*)    All other components within normal limits  I-STAT CHEM 8, ED - Abnormal; Notable for the following:    Potassium 3.3 (*)    Creatinine, Ser 1.30 (*)    Glucose, Bld 158 (*)    Calcium, Ion 1.07 (*)    All other components within normal limits  PROTIME-INR  APTT  CBC  DIFFERENTIAL  TRIGLYCERIDES  TSH  I-STAT TROPOININ, ED  CBG MONITORING, ED  I-STAT ARTERIAL BLOOD GAS, ED    Imaging Review Ct Head Wo Contrast  08/19/2015  ADDENDUM REPORT: 08/18/2015 13:58 ADDENDUM: These results were called by telephone at the time of interpretation on 09/12/2015 at 1:50 pm to Dr. Purnell Shoemaker, who verbally acknowledged these results. Electronically Signed   By: Vinnie Langton M.D.   On: 09/02/2015 13:58  08/24/2015  CLINICAL DATA:  70 year old male with right-sided weakness. Combative behavior. Code stroke. EXAM: CT HEAD WITHOUT CONTRAST TECHNIQUE: Contiguous axial images were obtained from the base of the skull through the vertex without intravenous contrast. COMPARISON:  No priors. FINDINGS: No acute intracranial abnormalities. Specifically, no evidence of acute intracranial hemorrhage, no definite findings of acute/subacute cerebral ischemia, no mass, mass effect, hydrocephalus or abnormal intra or extra-axial fluid collections. Visualized paranasal sinuses and mastoids are well pneumatized. No acute displaced skull fractures are identified. IMPRESSION: *No acute intracranial abnormalities. *The appearance of the brain is normal. Electronically Signed: By: Vinnie Langton M.D. On: 09/05/2015 13:42   Dg Chest Portable 1  View  08/15/2015  CLINICAL DATA:  ams all day, unknown last seen normal, intubated history of lung cancer. EXAM: PORTABLE CHEST 1 VIEW COMPARISON:  Plain film 09/07/2013 and CT of 09/10/2013 FINDINGS: Endotracheal tube terminates 4.2 cm above carina.Midline trachea. Normal heart size for level of inspiration. Right hemidiaphragm elevation is mild. No pleural effusion or pneumothorax. Similar to decreased miliary nodular pattern throughout both lungs. Slightly increased left base opacity is favored to represent atelectasis. IMPRESSION: Appropriate position of endotracheal tube. Similar to improved miliary pattern since 09/07/2013. This remains suspicious for pulmonary metastasis. Left base opacity, favoring atelectasis. Electronically Signed   By: Abigail Miyamoto M.D.   On: 09/04/2015 14:47   I have personally reviewed and evaluated these images and lab results as part of my medical decision-making.   EKG Interpretation   Date/Time:  Monday August 14 2015 14:08:07 EST Ventricular Rate:  101 PR Interval:  192 QRS Duration: 88 QT Interval:  357 QTC Calculation: 463 R Axis:   9 Text Interpretation:  Sinus tachycardia Anteroseptal infarct, old  Otherwise normal ECG No old tracing to compare Confirmed by Addysin Porco  MD,  Ishani Goldwasser ([id number]) on 08/30/2015 3:38:38 PM      CRITICAL CARE Performed by: Sherwood Gambler T   Total critical care time: 45 minutes  Critical care time was exclusive of separately billable procedures and treating other patients.  Critical care was necessary to treat or prevent imminent or life-threatening deterioration.  Critical care was time spent personally by me on the following activities: development of treatment plan with patient and/or surrogate as well as nursing, discussions with consultants, evaluation of patient's response to treatment, examination of patient, obtaining history from patient or surrogate, ordering and performing treatments and interventions, ordering and  review of laboratory studies, ordering and review of radiographic studies, pulse oximetry and re-evaluation of patient's condition.  MDM   Final diagnoses:  Acute respiratory failure, unspecified whether with hypoxia or hypercapnia ([place])    Patient presents with snoring respirations. CT scan is unremarkable. Initially unclear if his proptosis is new and represents brain swelling/mass such as a venous thrombosis. Neuro would like a CT angiogram as well as MRI. I did not feel comfortable with patient going to MRI given his snoring respirations. It is most likely related to the Versed given his agitation prior to this he was intubated for airway protection. He most likely had a partial seizure with subsequent agitation. After talking to family it became apparent that the proptosis is likely not new. However he was still get an MRI of orbits and brain. Dr. Lake Bells of ICU was consulted for ventilator management. They will admit to the ICU. Neuro will help evaluate for stroke versus seizure. Family updated on course of care and plan.    Sherwood Gambler, MD 09/04/2015 [phone]

## 2015-08-14 NOTE — ED Notes (Signed)
From home via [place] all day with no known LSN, obtunded on arrival (Versed per EMS), taken to CT on arrival, then back to trauma C for intubation prior to MRI and CTA

## 2015-08-15 ENCOUNTER — Inpatient Hospital Stay (HOSPITAL_COMMUNITY): Payer: Medicare Other

## 2015-08-15 DIAGNOSIS — Z789 Other specified health status: Secondary | ICD-10-CM

## 2015-08-15 DIAGNOSIS — R404 Transient alteration of awareness: Secondary | ICD-10-CM

## 2015-08-15 DIAGNOSIS — G9340 Encephalopathy, unspecified: Secondary | ICD-10-CM

## 2015-08-15 DIAGNOSIS — Z978 Presence of other specified devices: Secondary | ICD-10-CM | POA: Insufficient documentation

## 2015-08-15 DIAGNOSIS — J9600 Acute respiratory failure, unspecified whether with hypoxia or hypercapnia: Principal | ICD-10-CM

## 2015-08-15 DIAGNOSIS — C7949 Secondary malignant neoplasm of other parts of nervous system: Secondary | ICD-10-CM

## 2015-08-15 LAB — APTT: APTT: 36 s (ref 24–37)

## 2015-08-15 LAB — CSF CELL COUNT WITH DIFFERENTIAL
RBC Count, CSF: 118 /mm3 — ABNORMAL HIGH
RBC Count, CSF: 308 /mm3 — ABNORMAL HIGH
TUBE #: 1
TUBE #: 4
WBC CSF: 4 /mm3 (ref 0–5)
WBC, CSF: 6 /mm3 — ABNORMAL HIGH (ref 0–5)

## 2015-08-15 LAB — MAGNESIUM: MAGNESIUM: 2 mg/dL (ref 1.7–2.4)

## 2015-08-15 LAB — BASIC METABOLIC PANEL
ANION GAP: 10 (ref 5–15)
BUN: 10 mg/dL (ref 6–20)
CHLORIDE: 107 mmol/L (ref 101–111)
CO2: 27 mmol/L (ref 22–32)
Calcium: 9 mg/dL (ref 8.9–10.3)
Creatinine, Ser: 1.22 mg/dL (ref 0.61–1.24)
GFR calc Af Amer: >60 mL/min (ref >60)
GFR, EST NON AFRICAN AMERICAN: 59 mL/min — AB (ref >60)
GLUCOSE: 127 mg/dL — AB (ref 65–99)
POTASSIUM: 3.8 mmol/L (ref 3.5–5.1)
Sodium: 144 mmol/L (ref 135–145)

## 2015-08-15 LAB — PHOSPHORUS: Phosphorus: 1 mg/dL — CL (ref 2.5–4.6)

## 2015-08-15 LAB — PROTEIN AND GLUCOSE, CSF
GLUCOSE CSF: 79 mg/dL — AB (ref 40–70)
Total  Protein, CSF: 98 mg/dL — ABNORMAL HIGH (ref 15–45)

## 2015-08-15 LAB — GLUCOSE, CAPILLARY
GLUCOSE-CAPILLARY: 126 mg/dL — AB (ref 65–99)
GLUCOSE-CAPILLARY: 121 mg/dL — AB (ref 65–99)

## 2015-08-15 LAB — CBC
HCT: 41.3 % (ref 39.0–52.0)
HEMOGLOBIN: 13.5 g/dL (ref 13.0–17.0)
MCH: 27.4 pg (ref 26.0–34.0)
MCHC: 32.7 g/dL (ref 30.0–36.0)
MCV: 83.8 fL (ref 78.0–100.0)
PLATELETS: 236 10*3/uL (ref 150–400)
RBC: 4.93 MIL/uL (ref 4.22–5.81)
RDW: 15.3 % (ref 11.5–15.5)
WBC: 17.3 10*3/uL — AB (ref 4.0–10.5)

## 2015-08-15 LAB — CRYPTOCOCCAL ANTIGEN, CSF: Crypto Ag: NEGATIVE

## 2015-08-15 MED ORDER — PRO-STAT SUGAR FREE PO LIQD
60.0000 mL | Freq: Every day | ORAL | Status: DC
  Administered 2015-08-16: 05:00:00 via ORAL
  Filled 2015-08-15 (×14): qty 60

## 2015-08-15 MED ORDER — DEXAMETHASONE SODIUM PHOSPHATE 4 MG/ML IJ SOLN
10.0000 mg | Freq: Four times a day (QID) | INTRAMUSCULAR | Status: DC
  Administered 2015-08-15 – 2015-08-17 (×7): 10 mg via INTRAVENOUS
  Filled 2015-08-15 (×12): qty 2.5

## 2015-08-15 MED ORDER — VITAL HIGH PROTEIN PO LIQD
1000.0000 mL | ORAL | Status: DC
  Filled 2015-08-15 (×2): qty 1000

## 2015-08-15 MED ORDER — POTASSIUM PHOSPHATES 15 MMOLE/5ML IV SOLN
30.0000 meq | Freq: Once | INTRAVENOUS | Status: AC
  Administered 2015-08-15: 30 meq via INTRAVENOUS
  Filled 2015-08-15: qty 6.82

## 2015-08-15 MED ORDER — VITAL HIGH PROTEIN PO LIQD
1000.0000 mL | ORAL | Status: DC
  Administered 2015-08-16: 10:00:00
  Administered 2015-08-16: 1000 mL
  Filled 2015-08-15 (×3): qty 1000

## 2015-08-15 NOTE — Progress Notes (Signed)
CRITICAL VALUE ALERT  Critical value received:  0881  Date of notification:  08/15/2015  Time of notification:  1031  Critical value read back:Yes.    Nurse who received alert:  Donald Siva RN  MD notified (1st page):  Dr. Lamonte Sakai  Time of first page:  [phone]  MD notified (2nd page):  Time of second page:  Responding MD:  Dr. Lamonte Sakai  Time MD responded:  [phone]

## 2015-08-15 NOTE — Procedures (Signed)
Lumbar Puncture Procedure Note  Pre-operative Diagnosis: r/o met to brain  Post-operative Diagnosis: r/o mets to brain  Indications: Diagnostic  Procedure Details   Consent: Informed consent was obtained. Risks of the procedure were discussed including: infection, bleeding, pain and headache.  The patient was positioned under sterile conditions. Betadine solution and sterile drapes were utilized. A spinal needle was inserted at the L3 - L4 interspace.  Spinal fluid was obtained and sent to the laboratory.  Findings 6m of clear spinal fluid was obtained. Opening Pressure: normal cm H2O pressure. Closing Pressure: normalcm H2O pressure.  Complications:  None; patient tolerated the procedure well.        Condition: stable  Plan Bed rest for 5 hours.   DLavon Paganini FTitus Mould MD, FYakimaPgr: [facility]

## 2015-08-15 NOTE — Progress Notes (Signed)
Utilization review completed. Algie Westry, RN, BSN. 

## 2015-08-15 NOTE — Progress Notes (Signed)
Subjective: 70 year old male patient with lung cancer history, obtains cancer treatments at [facility] in [place]. His past medical history is largely unknown. His wife could not provide any history about his medical treatments. He presented yesterday in unresponsive state , likely postictal from a witnessed seizure by his family . MRI of the brain is significant for abnormal leptomeningeal enhancement suspicious for a leptomeningeal metastatic disease  EEG was done yesterday which showed encephalopathy, no abnormal discharges or seizures noted. He is currently on Keppra 1 g twice a day and sedated with propofol infusion, remains intubated. Noted to have some increases agitation overnight intermittently and was given when necessary Versed.  No definite clinical seizures during the hospitalization.   Current facility-administered medications:  .  0.9 %  sodium chloride infusion, 250 mL, Intravenous, PRN, Juanito Doom, MD .  0.9 %  sodium chloride infusion, , Intravenous, Continuous, Raylene Miyamoto, MD, Last Rate: 10 mL/hr at 08/15/15 1155 .  acetaminophen (TYLENOL) tablet 650 mg, 650 mg, Oral, Q4H PRN, Juanito Doom, MD .  albuterol (PROVENTIL) (2.5 MG/3ML) 0.083% nebulizer solution 2.5 mg, 2.5 mg, Nebulization, Q2H PRN, Juanito Doom, MD .  antiseptic oral rinse solution (CORINZ), 7 mL, Mouth Rinse, QID, Juanito Doom, MD, 7 mL at 08/15/15 1155 .  antiseptic oral rinse solution (CORINZ), 7 mL, Mouth Rinse, QID, Juanito Doom, MD, 0 mL at 08/15/15 0000 .  chlorhexidine gluconate (PERIDEX) 0.12 % solution 15 mL, 15 mL, Mouth Rinse, BID, Juanito Doom, MD, 15 mL at 08/24/2015 2000 .  chlorhexidine gluconate (PERIDEX) 0.12 % solution 15 mL, 15 mL, Mouth Rinse, BID, Juanito Doom, MD, 15 mL at 08/15/15 0735 .  dexamethasone (DECADRON) injection 10 mg, 10 mg, Intravenous, 4 times per day, Raylene Miyamoto, MD .  enoxaparin (LOVENOX) injection 40 mg, 40 mg, Subcutaneous,  Q24H, Juanito Doom, MD, 40 mg at 09/11/2015 2027 .  etomidate (AMIDATE) injection 20 mg, 20 mg, Intravenous, Once, Sherwood Gambler, MD, 20 mg at 09/07/2015 1408 .  etomidate (AMIDATE) injection, , Intravenous, PRN, Sherwood Gambler, MD, 20 mg at 08/19/2015 1401 .  famotidine (PEPCID) IVPB 20 mg premix, 20 mg, Intravenous, Q12H, Juanito Doom, MD, 20 mg at 08/15/15 1043 .  feeding supplement (PRO-STAT SUGAR FREE 64) liquid 60 mL, 60 mL, Oral, 5 X Daily, Ardeen Garland, RD .  feeding supplement (VITAL HIGH PROTEIN) liquid 1,000 mL, 1,000 mL, Per Tube, Q24H, Ardeen Garland, RD .  fentaNYL (SUBLIMAZE) injection 100 mcg, 100 mcg, Intravenous, Q15 min PRN, Juanito Doom, MD, 100 mcg at 08/15/15 1439 .  fentaNYL (SUBLIMAZE) injection 100 mcg, 100 mcg, Intravenous, Q2H PRN, Juanito Doom, MD, 100 mcg at 08/15/15 0500 .  iohexol (OMNIPAQUE) 350 MG/ML injection 50 mL, 50 mL, Intravenous, Once PRN, Thermon Zulauf Fuller Mandril, MD .  levETIRAcetam (KEPPRA) 1,000 mg in sodium chloride 0.9 % 100 mL IVPB, 1,000 mg, Intravenous, Q12H, Tattiana Fakhouri Fuller Mandril, MD, 1,000 mg at 08/15/15 0200 .  ondansetron (ZOFRAN) injection 4 mg, 4 mg, Intravenous, Q6H PRN, Juanito Doom, MD .  potassium phosphate 30 mEq in dextrose 5 % 500 mL infusion, 30 mEq, Intravenous, Once, Collier Salina, MD, 30 mEq at 08/15/15 1153 .  propofol (DIPRIVAN) 1000 MG/100ML infusion, 5-80 mcg/kg/min, Intravenous, Titrated, Sherwood Gambler, MD, Last Rate: 16.6 mL/hr at 08/15/15 1512, 25 mcg/kg/min at 08/15/15 1512 .  rocuronium (ZEMURON) injection, , Intravenous, PRN, Sherwood Gambler, MD, 100 mg at 08/16/2015 1401  Exam: Filed Vitals:   08/15/15 1537 08/15/15 1627  BP:  101/63  Pulse:  64  Temp: 98.4 F (36.9 C)   Resp:  20   Patient is sedated, intubated. No specific gaze deviation. Bilateral proptosis noted. Face grimace appears symmetric. He is slightly agitated and moving his all 4 extremities spontaneously with  agitation.   Impression:   70 year old male patient with a history of lung cancer, presented yesterday in a postictal state unresponsive following a witnessed seizure at home. He is in ICU, remains intubated sedated with propofol, and on Keppra 1 g twice a day for seizure prophylaxis MRI brain is abnormal with leptomeningeal enhancement suspicious for a leptomeningeal metastatic disease. Recommend lumbar puncture with CSF cytology analysis for further diagnostic evaluation. Other CSF infectious labs were also been ordered which is in the differential, including CSF angiotensin converting enzyme sarcoidosis. Appreciate Dr. Titus Mould for helping with the lumbar puncture.   We'll follow up the CSF results.

## 2015-08-15 NOTE — Progress Notes (Signed)
Initial Nutrition Assessment  DOCUMENTATION CODES:   Obesity unspecified  INTERVENTION:    Initiate TF via OGT with Vital High Protein at goal rate of 10 ml/h (240 ml per day) and Prostat 60 ml 5 times per day to provide 1240 kcals, 171 gm protein, 201 ml free water daily.  Total calorie intake with TF and propofol will be 1768 kcals per day, slightly exceeding calorie needs to ensure adequate protein intake.   NUTRITION DIAGNOSIS:   Inadequate oral intake related to inability to eat as evidenced by NPO status.  GOAL:   Provide needs based on ASPEN/SCCM guidelines  MONITOR:   Vent status, Labs, Weight trends, TF tolerance, I & O's  REASON FOR ASSESSMENT:   Consult Enteral/tube feeding initiation and management  ASSESSMENT:   70 year old male with largely unknown past medical history who was admitted to [facility] on 09/07/2015 in a post ictal state after a presumed first lifetime seizure.  Labs reviewed: phosphorus low at 1.  Discussed with RN today. Received MD Consult for TF initiation and management.  Patient is currently intubated on ventilator support Temp (24hrs), Avg:97.5 F (36.4 C), Min:95.2 F (35.1 C), Max:98.5 F (36.9 C)  Propofol: 20 ml/hr providing 528 kcals per day   Diet Order:  Diet NPO time specified  Skin:  Reviewed, no issues  Last BM:  unknown  Height:   Ht Readings from Last 1 Encounters:  08/22/2015 '6\' 2"'$  (1.88 m)    Weight:   Wt Readings from Last 1 Encounters:  08/15/15 242 lb 1 oz (109.8 kg)    Ideal Body Weight:  86.4 kg  BMI:  Body mass index is 31.07 kg/(m^2).  Estimated Nutritional Needs:   Kcal:  5170-0174  Protein:  >/= 173 gm  Fluid:  2 L  EDUCATION NEEDS:   No education needs identified at this time  Molli Barrows, [place], [place], [place] Pager [phone] After Hours Pager [phone]

## 2015-08-15 NOTE — Progress Notes (Addendum)
Name: Nathan Wolf MRN: [id number] DOB: 1946/07/10    LOS: 1  Referring Provider:  EDP Reason for Referral:  Acute respiratory failure due to inability protect airway  PULMONARY / CRITICAL CARE MEDICINE  HPI:  This is a 70 year old male with largely unknown past medical history who was admitted to [facility] on 08/27/2015 in a post ictal state after a presumed first lifetime seizure. He was encephalopathic upon arrival to [facility] but notably, this is in the setting of having received 5 mg of Versed for combativeness. By report, his family saw him "seizing" at home.  Subjective: remains vented, no seizures overnight  STUDIES:  CT head January 2: No acute intracranial abnormalities MRI brain January 2: Preliminary read no acute intracranial abnormality, no strokeDiffuse leptomeningeal enhancement. Additionally, small scattered parenchymal enhancing lesions which may be partially hemorrhagic or proteinaceous also noted suggesting the presence of diffuse metastatic disease given the patient's history lung cancer  CULTURES: None  ANTIBIOTICS: None  Events Since Admission: 1/2 admitted and intubated with acute resp failure and inability to protect airway  LINES/TUBES: January 2 endotracheal tube  Vital Signs: Temp:  [95.2 F (35.1 C)-98.5 F (36.9 C)] 98.5 F (36.9 C) (01/03 0749) Pulse Rate:  [69-104] 71 (01/03 0730) Resp:  [0-27] 14 (01/03 0730) BP: (104-172)/(60-100) 114/74 mmHg (01/03 0730) SpO2:  [95 %-100 %] 100 % (01/03 0730) FiO2 (%):  [40 %-100 %] 40 % (01/03 0413) Weight:  [242 lb 1 oz (109.8 kg)-243 lb 9.7 oz (110.5 kg)] 242 lb 1 oz (109.8 kg) (01/03 0404)  Physical Examination: General:  AAF male, sedated on vent Neuro:  Sedated, unresponsive HEENT:  [place]/AT, ET tube in place Cardiovascular:  RRR, no M/R/G Lungs:  CTA bilaterally Abdomen:  Soft NT/ND, hypoactive BS Musculoskeletal:  No edema Skin:  intact  Active Problems:    Respiratory failure (HCC)   Acute encephalopathy   ASSESSMENT AND PLAN NEUROLOGIC A:  New onset seizure, reportedly started a new cancer pill Acute encephalopathy > likely post ictal High risk metastatic lesion given history of lung cancer P:  RASS goal: 0 Keppra per neurology Propofol gtt Fentanyl intermittent PAD protocol Will LP this afternoon for cytology Decadron increase  PULMONARY A: Acute respiratory failure due to inability to protect airway History of lung cancer Abnormal CXR > diffuse lymphangitic spread from malignancy?, likely adenocarcinoma/BAC based on imaging P:  Full ventilator support for now Ventilator associated pneumonia prevention protocol Chest x-ray in a.m. Keep MV same cpap 5 ps 5 goal 2 hr, no extubation planned Need to track down lung cancer treatment records - ordered  CARDIOVASCULAR A:  No acute issues  P:  Telemetry monitoring Gentle IV fluids - kvo  RENAL A:  Hypokalemia improved hypophospatemia P:  Replace potassium Monitor BMET and UOP Replace electrolytes as needed kphos 30 mmole  GASTROINTESTINAL A:  No acute issues P:  Continue stress ulcer prophylaxis with H2 blocker Consider Enteral tube feeding after LP  HEMATOLOGIC A:  DV prevention, cancer pt P:  Monitor for bleeding lovenox  INFECTIOUS A:  No acute issues P:  Monitor for bleeding  ENDOCRINE A:  Exopthalamos ? ICP elevation P:  TSH 1.728 For LP  Nathan Wolf, M.D.  08/15/2015, 8:03 AM  STAFF NOTE: I, Nathan Roof, MD FACP have personally reviewed patient's available data, including medical history, events of note, physical examination and test results as part of my evaluation. I have discussed with resident/NP and other care providers such as pharmacist, RN  and RRT. In addition, I personally evaluated patient and elicited key findings of: no seizures overnight, coarse BS, ct in past achest c/w wide spread lung  cancer and now likely to brain , for LP , keppra maintain, weaning no extubation with current MS, eeg neg focus active, start TF, assess opening pressure and send for cytology, increase decadron, will need to speak to family about palliation likely  The patient is critically ill with multiple organ systems failure and requires high complexity decision making for assessment and support, frequent evaluation and titration of therapies, application of advanced monitoring technologies and extensive interpretation of multiple databases.   Critical Care Time devoted to patient care services described in this note is 35 Minutes. This time reflects time of care of this signee: Nathan Roof, MD FACP. This critical care time does not reflect procedure time, or teaching time or supervisory time of PA/NP/Med student/Med Resident etc but could involve care discussion time. Rest per NP/medical resident whose note is outlined above and that I agree with   Nathan Paganini. Titus Mould, MD, FACP Pgr: [facility] 08/15/2015 10:51 AM    I have had extensive discussions with family. We discussed patients current circumstances and organ failures. We also discussed patient's prior wishes under circumstances such as this. Family has decided to NOT perform resuscitation if arrest but to continue current medical support for now. No reintubation when plan extubation  Nathan Paganini. Titus Mould, MD, [place] Pgr: [place] [facility]

## 2015-08-16 ENCOUNTER — Inpatient Hospital Stay (HOSPITAL_COMMUNITY): Payer: Medicare Other

## 2015-08-16 DIAGNOSIS — Z4659 Encounter for fitting and adjustment of other gastrointestinal appliance and device: Secondary | ICD-10-CM | POA: Insufficient documentation

## 2015-08-16 LAB — HIV ANTIBODY (ROUTINE TESTING W REFLEX): HIV SCREEN 4TH GENERATION: NONREACTIVE

## 2015-08-16 LAB — BASIC METABOLIC PANEL
ANION GAP: 11 (ref 5–15)
BUN: 14 mg/dL (ref 6–20)
CHLORIDE: 110 mmol/L (ref 101–111)
CO2: 23 mmol/L (ref 22–32)
Calcium: 9 mg/dL (ref 8.9–10.3)
Creatinine, Ser: 1.28 mg/dL — ABNORMAL HIGH (ref 0.61–1.24)
GFR calc Af Amer: >60 mL/min (ref >60)
GFR calc non Af Amer: 55 mL/min — ABNORMAL LOW (ref >60)
GLUCOSE: 132 mg/dL — AB (ref 65–99)
POTASSIUM: 3.5 mmol/L (ref 3.5–5.1)
Sodium: 144 mmol/L (ref 135–145)

## 2015-08-16 LAB — HERPES SIMPLEX VIRUS(HSV) DNA BY PCR
HSV 1 DNA: NEGATIVE
HSV 2 DNA: NEGATIVE

## 2015-08-16 LAB — CBC
HEMATOCRIT: 41.3 % (ref 39.0–52.0)
HEMOGLOBIN: 13.8 g/dL (ref 13.0–17.0)
MCH: 27.7 pg (ref 26.0–34.0)
MCHC: 33.4 g/dL (ref 30.0–36.0)
MCV: 82.8 fL (ref 78.0–100.0)
Platelets: 283 10*3/uL (ref 150–400)
RBC: 4.99 MIL/uL (ref 4.22–5.81)
RDW: 15.7 % — ABNORMAL HIGH (ref 11.5–15.5)
WBC: 22.4 10*3/uL — ABNORMAL HIGH (ref 4.0–10.5)

## 2015-08-16 LAB — GLUCOSE, CAPILLARY
GLUCOSE-CAPILLARY: 131 mg/dL — AB (ref 65–99)
GLUCOSE-CAPILLARY: 154 mg/dL — AB (ref 65–99)
Glucose-Capillary: 111 mg/dL — ABNORMAL HIGH (ref 65–99)
Glucose-Capillary: 124 mg/dL — ABNORMAL HIGH (ref 65–99)
Glucose-Capillary: 132 mg/dL — ABNORMAL HIGH (ref 65–99)
Glucose-Capillary: 142 mg/dL — ABNORMAL HIGH (ref 65–99)

## 2015-08-16 LAB — VDRL, CSF: SYPHILIS VDRL QUANT CSF: NONREACTIVE

## 2015-08-16 LAB — PHOSPHORUS: Phosphorus: 3.2 mg/dL (ref 2.5–4.6)

## 2015-08-16 MED ORDER — LEVETIRACETAM 100 MG/ML PO SOLN
1000.0000 mg | Freq: Two times a day (BID) | ORAL | Status: DC
  Filled 2015-08-16: qty 10

## 2015-08-16 MED ORDER — LEVETIRACETAM 500 MG/5ML IV SOLN
1000.0000 mg | Freq: Two times a day (BID) | INTRAVENOUS | Status: DC
  Administered 2015-08-16 – 2015-08-20 (×8): 1000 mg via INTRAVENOUS
  Filled 2015-08-16 (×12): qty 10

## 2015-08-16 MED ORDER — DEXMEDETOMIDINE HCL IN NACL 400 MCG/100ML IV SOLN
0.0000 ug/kg/h | INTRAVENOUS | Status: DC
  Administered 2015-08-16 (×2): 1.1 ug/kg/h via INTRAVENOUS
  Administered 2015-08-16: 0.4 ug/kg/h via INTRAVENOUS
  Administered 2015-08-16 (×2): 1.1 ug/kg/h via INTRAVENOUS
  Administered 2015-08-17: 0.9 ug/kg/h via INTRAVENOUS
  Filled 2015-08-16: qty 100
  Filled 2015-08-16: qty 50
  Filled 2015-08-16 (×5): qty 100

## 2015-08-16 NOTE — Procedures (Signed)
ELECTROENCEPHALOGRAM REPORT   Patient: KATIE MOCH        Age: 70 y.o.        Sex: male Referring Physician: Dr Silverio Decamp Report Date:  08/16/2015        Interpreting Physician: Hulen Luster  History: HOBIE KOHLES is an 70 y.o. male with brain mets, with AMS and possible seizure activity  Medications:  On precedex  Conditions of Recording:  This is a 16 channel EEG carried out with the patient in the sedated state.  Description:  There background rhythm consists predominantly of a mix of low to medium voltage theta and delta activity. No posterior dominant alpha rhythm noted. Normal sleep architecture is not observed. No focal slowing or epileptiform activity noted.  Hyperventilation was not performed. Intermittent photic stimulation was not performed.   IMPRESSION: Abnormal EEG due to the presence of a generalized slowing indicating a moderate to severe cerebral disturbance (encephalopathy). No focal slowing or epileptiform activity noted.    Jim Like, DO [facility] [phone]  If 7pm- 7am, please page neurology on call as listed in AMION. 08/16/2015, 5:51 PM

## 2015-08-16 NOTE — Progress Notes (Addendum)
Name: Nathan Wolf MRN: [id number] DOB: 08/06/1946    LOS: 2  Referring Provider:  EDP Reason for Referral:  Acute respiratory failure due to inability protect airway  PULMONARY / CRITICAL CARE MEDICINE  HPI:  This is a 70 year old male with largely unknown past medical history who was admitted to [facility] on 09/06/2015 in a post ictal state after a presumed first lifetime seizure. He was encephalopathic upon arrival to [facility] but notably, this is in the setting of having received 5 mg of Versed for combativeness. By report, his family saw him "seizing" at home.  Subjective: no seizures overnight, still on vent  STUDIES:  CT head January 2: No acute intracranial abnormalities MRI brain January 2: Preliminary read no acute intracranial abnormality, no strokeDiffuse leptomeningeal enhancement. Additionally, small scattered parenchymal enhancing lesions which may be partially hemorrhagic or proteinaceous also noted suggesting the presence of diffuse metastatic disease given the patient's history lung cancer  CULTURES: 1/3 CSF cx >   ANTIBIOTICS: None  Events Since Admission: 1/2 admitted and intubated with acute resp failure and inability to protect airway 1/3 lumbar puncture performed  LINES/TUBES: January 2 endotracheal tube  Vital Signs: Temp:  [97.7 F (36.5 C)-98.5 F (36.9 C)] 97.7 F (36.5 C) (01/04 0422) Pulse Rate:  [56-92] 59 (01/04 0600) Resp:  [14-21] 20 (01/04 0600) BP: (94-126)/(61-77) 104/71 mmHg (01/04 0600) SpO2:  [99 %-100 %] 99 % (01/04 0600) FiO2 (%):  [40 %] 40 % (01/04 0415) Weight:  [237 lb 14 oz (107.9 kg)] 237 lb 14 oz (107.9 kg) (01/04 0500)  Vent Mode:  [-] PRVC FiO2 (%):  [40 %] 40 % Set Rate:  [14 bmp-20 bmp] 20 bmp Vt Set:  [650 mL] 650 mL PEEP:  [5 cmH20] 5 cmH20 Pressure Support:  [10 cmH20] 10 cmH20 Plateau Pressure:  [22 QBH41-93 cmH20] 22 cmH20  Physical Examination: General:  AAF male, sedated on  vent Neuro:  Sedated, unresponsive, downgoing plantar reflex HEENT:  PERRL, [place]/AT, ET tube in place, exophthalmos Cardiovascular:  Slightly brady, no M/R/G Lungs:  CTA anteriorly Abdomen:  Soft NT/ND, hypoactive BS Musculoskeletal:  No edema, DP pulses intact, SCDs in place Skin:  intact  Active Problems:   Respiratory failure (HCC)   Acute encephalopathy   Acute respiratory failure (HCC)   Altered awareness, transient   Endotracheally intubated   ASSESSMENT AND PLAN NEUROLOGIC A:  New onset seizure, reportedly started a new cancer pill Acute encephalopathy > likely post ictal High risk metastatic lesion given history of lung cancer P:  RASS goal: 0 Keppra per neurology Propofol gtt with WUA Fentanyl intermittent PAD protocol LP on 1/3: CSF cx no organisms, glucose 79, protein 98, f/u pending results Decadron 10 mg IV q6h, maintain   PULMONARY A: Acute respiratory failure due to inability to protect airway History of lung cancer Abnormal CXR > diffuse lymphangitic spread from malignancy?, likely adenocarcinoma/BAC based on imaging P:  Full ventilator support for now Ventilator associated pneumonia prevention protocol Chest x-ray in a.m. Keep MV same cpap 5 ps 5 goal 2 hr, assess rsbi, airway protection skills Strong cough noted NSCLC, [facility]  CARDIOVASCULAR A:  DNR, brady from prop earlier P:  Telemetry monitoring Gentle IV fluids - kvo  RENAL A:  Hypokalemia improved hypophospatemia improved P:  Monitor BMET and UOP Replace electrolytes as needed kvo  GASTROINTESTINAL A:  TF P:  Continue stress ulcer prophylaxis with H2 blocker Hold tube feed  HEMATOLOGIC A:  DV prevention, cancer pt, wbc noted from steroids  P:  Monitor for bleeding lovenox  INFECTIOUS A:  No acute issues P:  Monitor for bleeding  ENDOCRINE A:  Exopthalamos ? ICP elevation, family state this is his  normal P:  TSH 1.728 LP normal opening pressure  Zada Finders, M.D.  08/16/2015, 7:34 AM   STAFF NOTE: I, Merrie Roof, MD FACP have personally reviewed patient's available data, including medical history, events of note, physical examination and test results as part of my evaluation. I have discussed with resident/NP and other care providers such as pharmacist, RN and RRT. In addition, I personally evaluated patient and elicited key findings of: more awake, agitation, hates the ett, [pcxr c/w wide spread mets, DNI  DNR, wean cpap5 ps 69m goal 30 min , assess rsbi, has strong cough, WUA, keep roids, follow cytology CSF, family updated by me The patient is critically ill with multiple organ systems failure and requires high complexity decision making for assessment and support, frequent evaluation and titration of therapies, application of advanced monitoring technologies and extensive interpretation of multiple databases.   Critical Care Time devoted to patient care services described in this note is30 Minutes. This time reflects time of care of this signee: DMerrie Roof MD FACP. This critical care time does not reflect procedure time, or teaching time or supervisory time of PA/NP/Med student/Med Resident etc but could involve care discussion time. Rest per NP/medical resident whose note is outlined above and that I agree with   DLavon Paganini FTitus Mould MD, FAttallaPgr: 3Sac[facility] Flats VillagePulmonary & Critical Care 08/16/2015 9:48 AM  '

## 2015-08-16 NOTE — Progress Notes (Signed)
Bedside EEG completed, results pending. 

## 2015-08-16 NOTE — Procedures (Signed)
Extubation Procedure Note  Patient Details:   Name: ARTY LANTZY DOB: 11-02-1945 MRN: [id number]   Airway Documentation:     Evaluation  O2 sats: stable throughout Complications: No apparent complications Patient did tolerate procedure well. Bilateral Breath Sounds: Clear Suctioning: Airway Yes   Patient extubated to 3L nasal cannula per MD order.  Positive cuff leak noted.  No evidence of stridor.  Sats currently maintained at 99%.  Vitals are stable.  No apparent complications.      Alphia Moh N 08/16/2015, 10:10 AM

## 2015-08-16 NOTE — Progress Notes (Signed)
Subjective: Intubated and on sedation  Exam: Filed Vitals:   08/16/15 0800 08/16/15 0817  BP: 117/73   Pulse: 56   Temp:  97.4 F (36.3 C)  Resp: 20     HEENT-  Normocephalic, no lesions, without obvious abnormality.  Normal external eye and conjunctiva.  Normal TM's bilaterally.  Normal auditory canals and external ears. Normal external nose, mucus membranes and septum.  Normal pharynx. Cardiovascular- S1, S2 normal, pulses palpable throughout   Lungs- chest clear, no wheezing, rales, normal symmetric air entry Abdomen- normal findings: bowel sounds normal     Gen: In bed, NAD MS: Sedated, intubated, follows no commands.  CN: sever proptosis, no dolls, minimal pupillary, face symmetric Motor: moving all extremities antigravity spontaniously Sensory: withdraws from pain   Pertinent Labs: wbc 22.4 Prelim CSF culture  Shows no growth CSF --colorless, clear, 118 RBC, 4 WBC, glucose 79 and protein 98 ACE, HSV, VRDL, [place], Cytology pending MRI brain is abnormal with leptomeningeal enhancement suspicious for a leptomeningeal metastatic disease.  Etta Quill PA-C [facility] [phone]  Impression: 70 year old male patient with a history of lung cancer, presented yesterday in a postictal state unresponsive following a witnessed seizure at home. He is in ICU, remains intubated sedated with propofol, and on Keppra 1 g twice a day for seizure prophylaxis MRI brain is abnormal with leptomeningeal enhancement suspicious for a leptomeningeal metastatic disease.   Recommendations: 1) awaiting cytology and CSF ACE, HSV, VRDL, [place] 2) continue Keppra at current dose.     08/16/2015, 9:12 AM

## 2015-08-17 ENCOUNTER — Inpatient Hospital Stay (HOSPITAL_COMMUNITY): Payer: Medicare Other

## 2015-08-17 DIAGNOSIS — R9402 Abnormal brain scan: Secondary | ICD-10-CM

## 2015-08-17 DIAGNOSIS — R569 Unspecified convulsions: Secondary | ICD-10-CM

## 2015-08-17 LAB — CBC
HCT: 42.9 % (ref 39.0–52.0)
Hemoglobin: 14.2 g/dL (ref 13.0–17.0)
MCH: 27.7 pg (ref 26.0–34.0)
MCHC: 33.1 g/dL (ref 30.0–36.0)
MCV: 83.8 fL (ref 78.0–100.0)
Platelets: 239 K/uL (ref 150–400)
RBC: 5.12 MIL/uL (ref 4.22–5.81)
RDW: 15.6 % — ABNORMAL HIGH (ref 11.5–15.5)
WBC: 22.5 K/uL — ABNORMAL HIGH (ref 4.0–10.5)

## 2015-08-17 LAB — BASIC METABOLIC PANEL WITH GFR
Anion gap: 11 (ref 5–15)
BUN: 24 mg/dL — ABNORMAL HIGH (ref 6–20)
CO2: 23 mmol/L (ref 22–32)
Calcium: 8.9 mg/dL (ref 8.9–10.3)
Chloride: 112 mmol/L — ABNORMAL HIGH (ref 101–111)
Creatinine, Ser: 1.32 mg/dL — ABNORMAL HIGH (ref 0.61–1.24)
GFR calc Af Amer: >60 mL/min
GFR calc non Af Amer: 53 mL/min — ABNORMAL LOW
Glucose, Bld: 162 mg/dL — ABNORMAL HIGH (ref 65–99)
Potassium: 4.2 mmol/L (ref 3.5–5.1)
Sodium: 146 mmol/L — ABNORMAL HIGH (ref 135–145)

## 2015-08-17 LAB — GLUCOSE, CAPILLARY
Glucose-Capillary: 123 mg/dL — ABNORMAL HIGH (ref 65–99)
Glucose-Capillary: 129 mg/dL — ABNORMAL HIGH (ref 65–99)

## 2015-08-17 MED ORDER — HALOPERIDOL LACTATE 5 MG/ML IJ SOLN
1.0000 mg | INTRAMUSCULAR | Status: DC | PRN
  Administered 2015-08-17 (×3): 4 mg via INTRAVENOUS
  Filled 2015-08-17 (×3): qty 1

## 2015-08-17 MED ORDER — LORAZEPAM 2 MG/ML IJ SOLN
1.0000 mg | Freq: Four times a day (QID) | INTRAMUSCULAR | Status: DC | PRN
Start: 2015-08-17 — End: 2015-08-17

## 2015-08-17 MED ORDER — LORAZEPAM 2 MG/ML IJ SOLN
INTRAMUSCULAR | Status: AC
  Administered 2015-08-17: 2 mg
  Filled 2015-08-17: qty 1

## 2015-08-17 MED ORDER — LORAZEPAM 2 MG/ML IJ SOLN
1.0000 mg | Freq: Two times a day (BID) | INTRAMUSCULAR | Status: DC
  Administered 2015-08-17: 1 mg via INTRAVENOUS
  Filled 2015-08-17: qty 1

## 2015-08-17 MED ORDER — MIDAZOLAM HCL 2 MG/2ML IJ SOLN
1.0000 mg | INTRAMUSCULAR | Status: DC | PRN
Start: 2015-08-17 — End: 2015-08-18

## 2015-08-17 MED ORDER — DEXAMETHASONE SODIUM PHOSPHATE 10 MG/ML IJ SOLN
6.0000 mg | Freq: Four times a day (QID) | INTRAMUSCULAR | Status: DC
  Administered 2015-08-17 – 2015-08-20 (×13): 6 mg via INTRAVENOUS
  Filled 2015-08-17: qty 1
  Filled 2015-08-17: qty 1.5
  Filled 2015-08-17 (×4): qty 1
  Filled 2015-08-17 (×2): qty 1.5
  Filled 2015-08-17: qty 1
  Filled 2015-08-17: qty 1.5
  Filled 2015-08-17 (×5): qty 1

## 2015-08-17 MED ORDER — MIDAZOLAM HCL 2 MG/2ML IJ SOLN: 2.0000 mg | INTRAMUSCULAR | Status: DC | PRN

## 2015-08-17 MED ORDER — LORAZEPAM 2 MG/ML IJ SOLN
2.0000 mg | Freq: Once | INTRAMUSCULAR | Status: AC
  Administered 2015-08-17: 2 mg via INTRAVENOUS

## 2015-08-17 MED ORDER — LORAZEPAM 2 MG/ML IJ SOLN
1.0000 mg | Freq: Four times a day (QID) | INTRAMUSCULAR | Status: DC
  Administered 2015-08-17 – 2015-08-18 (×4): 1 mg via INTRAVENOUS
  Filled 2015-08-17 (×4): qty 1

## 2015-08-17 NOTE — Progress Notes (Signed)
Subjective: Extubated and on sedation.   Exam: Filed Vitals:   08/17/15 0700 08/17/15 0800  BP: 138/75 131/74  Pulse: 48 79  Temp: 95.2 F (35.1 C) 96.1 F (35.6 C)  Resp: 11 12    HEENT-  Normocephalic, no lesions, without obvious abnormality.  Normal external eye and conjunctiva.  Normal TM's bilaterally.  Normal auditory canals and external ears. Normal external nose, mucus membranes and septum.  Normal pharynx. Cardiovascular- S1, S2 normal, pulses palpable throughout   Lungs- chest clear, no wheezing, rales, normal symmetric air entry Abdomen- normal findings: bowel sounds normal     Gen: In bed, NAD MS: Sedated, extubated, restless but follows no commands. He does localize to pain with arms.   CN: doll's pupillary intact. Minimal EOMI with proptosis which looks to have improved slightly. Blinks to threat.  Motor: moving all extremities antigravity spontaniously Sensory: withdraws from pain   Pertinent Labs: wbc 22.5 Prelim CSF culture  Shows no growth CSF --colorless, clear, 118 RBC, 4 WBC, glucose 79 and protein 98, HSV (-), VRDL (-) ACE,  [place], Cytology pending MRI brain is abnormal with leptomeningeal enhancement suspicious for a leptomeningeal metastatic disease.  Etta Quill PA-C [facility] [phone]  Impression: 70 year old male patient with a history of lung cancer, presented yesterday in a postictal state unresponsive following a witnessed seizure at home. He is in ICU, remains intubated sedated with precedex, and on Keppra 1 g twice a day for seizure prophylaxis MRI brain is abnormal with leptomeningeal enhancement suspicious for a leptomeningeal metastatic disease. Repeat EEG on 1/4 showed encephalopathy with no epileptiform activity.    Recommendations: 1) awaiting cytology and CSF ACE, [place] 2) continue Keppra at current dose.     08/17/2015, 9:01 AM

## 2015-08-17 NOTE — Progress Notes (Signed)
Name: Nathan Wolf MRN: [id number] DOB: 1946/01/30    LOS: 3  Referring Provider:  EDP Reason for Referral:  Acute respiratory failure due to inability protect airway  PULMONARY / CRITICAL CARE MEDICINE  HPI:  This is a 70 year old male with largely unknown past medical history who was admitted to [facility] on 08/24/2015 in a post ictal state after a presumed first lifetime seizure. He was encephalopathic upon arrival to [facility] but notably, this is in the setting of having received 5 mg of Versed for combativeness. By report, his family saw him "seizing" at home.  Subjective: extubated yesterday, brady overnight on precedex --> held and became agitated, put on soft restraints given haldol and ativan  STUDIES:  CT head January 2: No acute intracranial abnormalities MRI brain January 2: Preliminary read no acute intracranial abnormality, no strokeDiffuse leptomeningeal enhancement. Additionally, small scattered parenchymal enhancing lesions which may be partially hemorrhagic or proteinaceous also noted suggesting the presence of diffuse metastatic disease given the patient's history lung cancer EEG January 4: Abnormal EEG due to the presence of a generalized slowing indicating a moderate to severe cerebral disturbance (encephalopathy). No focal slowing or epileptiform activity noted.   CULTURES: 1/3 CSF cx > NGTD  ANTIBIOTICS: None  Events Since Admission: 1/2 admitted and intubated with acute resp failure and inability to protect airway 1/3 lumbar puncture performed 1/4 extubated  LINES/TUBES: January 2 endotracheal tube >extubated Jan 4  Vital Signs: Temp:  [94.6 F (34.8 C)-97.4 F (36.3 C)] 95.2 F (35.1 C) (01/05 0700) Pulse Rate:  [39-107] 48 (01/05 0700) Resp:  [10-20] 11 (01/05 0700) BP: (101-160)/(67-98) 138/75 mmHg (01/05 0700) SpO2:  [98 %-100 %] 100 % (01/05 0700) FiO2 (%):  [40 %] 40 % (01/04 0900)   Physical Examination: General:   AAF male, agitated, on soft restraints Neuro:  agitated, withdraws to light shined in eyes HEENT:  PERRL, [place]/AT, exophthalmos Cardiovascular: RRR, no M/R/G Lungs:  Course breath sounds right anteriolateral chest Abdomen:  Soft NT/ND, normal BS Musculoskeletal:  No edema, DP pulses intact, SCDs in place Skin:  intact  Active Problems:   Respiratory failure (HCC)   Acute encephalopathy   Acute respiratory failure (HCC)   Altered awareness, transient   Endotracheally intubated   Encounter for orogastric (OG) tube placement   ASSESSMENT AND PLAN NEUROLOGIC A:  New onset seizure, reportedly started a new cancer pill Acute encephalopathy > likely post ictal High risk metastatic lesion given history of lung cancer P:  RASS goal: 0 Keppra per neurology Fentanyl intermittent LP on 1/3: CSF cx no organisms, glucose 79, protein 98, f/u pending results Decadron 10 mg IV q6h, consider reduction precedex in hopes to reduce Avoid haldol with seizures Addition ativan scheduled, prn versed  PULMONARY A: Acute respiratory failure due to inability to protect airway History of lung cancer, NSCLC, adenocarcinoma EGFR + Abnormal CXR > diffuse lymphangitic spread from malignancy?, likely adenocarcinoma/BAC based on imaging P:  Extubated 1/4 Strong cough noted NSCLC, [facility] Poor prognosis, if distress comfort established  CARDIOVASCULAR A:  DNR, brady from precedex earlier P:  Telemetry monitoring Gentle IV fluids - kvo Reduce precedex to off as able DNR  RENAL A:  Hypokalemia improved hypophospatemia improved BUN from roids P:  Monitor BMET and UOP (1070 last 24 hrs) Replace electrolytes as needed kvo Chem in am   GASTROINTESTINAL A:  NPO P:  Continue stress ulcer prophylaxis with H2 blocker tube feeds may  be needed  HEMATOLOGIC A:  DV prevention, cancer pt, wbc noted from steroids  P:  Monitor for  bleeding lovenox reducing roids  INFECTIOUS A:  No acute issues P:  Monitor for bleeding  ENDOCRINE A:  Exopthalamosbaseline P:  TSH 1.728 LP normal opening pressure  Zada Finders, M.D. Internal Medicine PGY1  08/17/2015, 7:54 AM  STAFF NOTE: I, Merrie Roof, MD FACP have personally reviewed patient's available data, including medical history, events of note, physical examination and test results as part of my evaluation. I have discussed with resident/NP and other care providers such as pharmacist, RN and RRT. In addition, I personally evaluated patient and elicited key findings of: less agitated, precedex at 0.4, would avoid haldol with seizures, add scheduled benzo, prn versed, DNR / DNI, reduce steroids, chem in am , no pcxr required, palliation as goals, hypothermia frmo brain mets? Dysregulation?, prognosis poor but established  Goals done, O2 low, would not escalate The patient is critically ill with multiple organ systems failure and requires high complexity decision making for assessment and support, frequent evaluation and titration of therapies, application of advanced monitoring technologies and extensive interpretation of multiple databases.   Critical Care Time devoted to patient care services described in this note is 30 Minutes. This time reflects time of care of this signee: Merrie Roof, MD FACP. This critical care time does not reflect procedure time, or teaching time or supervisory time of PA/NP/Med student/Med Resident etc but could involve care discussion time. Rest per NP/medical resident whose note is outlined above and that I agree with   Lavon Paganini. Titus Mould, MD, [place] Pgr: [facility] 08/17/2015 9:09 AM

## 2015-08-18 ENCOUNTER — Encounter (HOSPITAL_COMMUNITY): Payer: Self-pay | Admitting: General Practice

## 2015-08-18 DIAGNOSIS — Z515 Encounter for palliative care: Secondary | ICD-10-CM

## 2015-08-18 LAB — GLUCOSE, CAPILLARY
GLUCOSE-CAPILLARY: 121 mg/dL — AB (ref 65–99)
GLUCOSE-CAPILLARY: 119 mg/dL — AB (ref 65–99)
GLUCOSE-CAPILLARY: 100 mg/dL — AB (ref 65–99)
GLUCOSE-CAPILLARY: 100 mg/dL — AB (ref 65–99)
Glucose-Capillary: 118 mg/dL — ABNORMAL HIGH (ref 65–99)
Glucose-Capillary: 122 mg/dL — ABNORMAL HIGH (ref 65–99)
Glucose-Capillary: 144 mg/dL — ABNORMAL HIGH (ref 65–99)

## 2015-08-18 LAB — BASIC METABOLIC PANEL
Anion gap: 10 (ref 5–15)
BUN: 24 mg/dL — AB (ref 6–20)
CO2: 25 mmol/L (ref 22–32)
CREATININE: 1.31 mg/dL — AB (ref 0.61–1.24)
Calcium: 8.8 mg/dL — ABNORMAL LOW (ref 8.9–10.3)
Chloride: 114 mmol/L — ABNORMAL HIGH (ref 101–111)
GFR calc Af Amer: >60 mL/min (ref >60)
GFR, EST NON AFRICAN AMERICAN: 54 mL/min — AB (ref >60)
GLUCOSE: 119 mg/dL — AB (ref 65–99)
Potassium: 3.7 mmol/L (ref 3.5–5.1)
SODIUM: 149 mmol/L — AB (ref 135–145)

## 2015-08-18 LAB — CBC
HCT: 44.6 % (ref 39.0–52.0)
Hemoglobin: 14.6 g/dL (ref 13.0–17.0)
MCH: 27.8 pg (ref 26.0–34.0)
MCHC: 32.7 g/dL (ref 30.0–36.0)
MCV: 84.8 fL (ref 78.0–100.0)
PLATELETS: 292 10*3/uL (ref 150–400)
RBC: 5.26 MIL/uL (ref 4.22–5.81)
RDW: 16 % — AB (ref 11.5–15.5)
WBC: 24.7 10*3/uL — AB (ref 4.0–10.5)

## 2015-08-18 LAB — WEST NILE AB, IGG AND IGM, CSF: West Nile Ab, IgG, CSF: <1.3 index (ref <1.30)

## 2015-08-18 MED ORDER — BISACODYL 10 MG RE SUPP: 10.0000 mg | Freq: Every day | RECTAL | Status: DC | PRN

## 2015-08-18 MED ORDER — ACETAMINOPHEN 325 MG PO TABS: 650.0000 mg | ORAL_TABLET | Freq: Four times a day (QID) | ORAL | Status: DC | PRN

## 2015-08-18 MED ORDER — LORAZEPAM 2 MG/ML IJ SOLN
1.0000 mg | INTRAMUSCULAR | Status: DC
  Administered 2015-08-18 – 2015-08-19 (×3): 1 mg via INTRAVENOUS
  Filled 2015-08-18 (×3): qty 1

## 2015-08-18 MED ORDER — CETYLPYRIDINIUM CHLORIDE 0.05 % MT LIQD
7.0000 mL | Freq: Two times a day (BID) | OROMUCOSAL | Status: DC
  Administered 2015-08-18 (×2): 7 mL via OROMUCOSAL

## 2015-08-18 MED ORDER — LORAZEPAM 2 MG/ML IJ SOLN
1.0000 mg | INTRAMUSCULAR | Status: DC | PRN
  Administered 2015-08-18 – 2015-08-19 (×4): 1 mg via INTRAVENOUS
  Filled 2015-08-18 (×3): qty 1

## 2015-08-18 MED ORDER — SODIUM CHLORIDE 0.45 % IV SOLN
INTRAVENOUS | Status: DC
  Administered 2015-08-18: 75 mL/h via INTRAVENOUS

## 2015-08-18 MED ORDER — GLYCOPYRROLATE 0.2 MG/ML IJ SOLN
0.2000 mg | INTRAMUSCULAR | Status: DC | PRN
  Administered 2015-08-20: 0.2 mg via INTRAVENOUS
  Filled 2015-08-18 (×3): qty 1

## 2015-08-18 MED ORDER — CHLORHEXIDINE GLUCONATE 0.12 % MT SOLN
15.0000 mL | Freq: Two times a day (BID) | OROMUCOSAL | Status: DC
  Administered 2015-08-18: 15 mL via OROMUCOSAL
  Filled 2015-08-18: qty 15

## 2015-08-18 MED ORDER — GLYCOPYRROLATE 1 MG PO TABS
1.0000 mg | ORAL_TABLET | ORAL | Status: DC | PRN
  Filled 2015-08-18: qty 1

## 2015-08-18 MED ORDER — GLYCOPYRROLATE 0.2 MG/ML IJ SOLN
0.2000 mg | INTRAMUSCULAR | Status: DC | PRN
  Administered 2015-08-19: 0.2 mg via SUBCUTANEOUS
  Filled 2015-08-18 (×2): qty 1

## 2015-08-18 MED ORDER — ACETAMINOPHEN 650 MG RE SUPP: 650.0000 mg | Freq: Four times a day (QID) | RECTAL | Status: DC | PRN

## 2015-08-18 NOTE — Consult Note (Cosign Needed)
Assessment at bedside, patient son Jaydrian Corpening. Present.  Patient wife recently left due to inclement weather approaching.   Mr. Keim is awake, but non-responsive, moving arms and legs in bed, does not appear to be in acute pain, although restless behavior is indicative of struggle.  Will address  medication adjustment with Palliative MD, Gene Domingo Cocking.  Discussed palliative care approach with [place].  He indicated that the family is aware of this likely terminal state his father has entered.  He verbalizes that there is a very large extended family here locally and that most plan to visit this weekend.  He is tearful yet grateful for the relief of suffering.  Emotional support provided.   Discussion and collaboration discussed with Dr. Domingo Cocking.  Orders for scheduled Ativan with PRN dosing will be implemented.  Goal is for full comfort, support will be continued for family.  Prognosis is hours to days.  If he survives the weekend and symptoms are managed, possible hospice referral on Monday.  Kizzie Fantasia, MSN, RN-BC, [facility]

## 2015-08-18 NOTE — Progress Notes (Signed)
Utilization review completed.  

## 2015-08-18 NOTE — Progress Notes (Signed)
Report received from James Ivanoff, RN.  Pt reassessed to be consistent with report given by Ms. Spencer. Family at bedside speaking with Palliative Care representative.

## 2015-08-18 NOTE — Progress Notes (Signed)
Patient Demographics  Nathan Wolf, is a 70 y.o. male, DOB - [birth year], JGO:[id number]  Admit date - 09/01/2015   Admitting Physician Juanito Doom, MD  Outpatient Primary MD for the patient is No primary care provider on file.  LOS - 4   Chief Complaint  Patient presents with  . Code Stroke       Admission HPI/Brief narrative: 70 year old male with past medical history of hypertension, hyperlipidemia, PTSD, lung cancer(unclear what stage), having all his care at the 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  Subjective:   Nathan Wolf is noncommunicative, cannot provide any complaints.  Assessment & Plan    Active Problems:   Respiratory failure (HCC)   Acute encephalopathy   Acute respiratory failure (HCC)   Altered awareness, transient   Endotracheally intubated   Encounter for orogastric (OG) tube placement  Acute encephalopathy - Differential diagnoses include postictal versus intracranial metastasis(brain metastases / leptomeningeal disease) - Remains encephalopathic, agitated, currently off sedation. - Continue with Keppra at current dose - Palliative care consulted regarding further recommendation for agitation management, meanwhile will keep on when necessary Ativan and Haldol   Lung cancer - NSCLC, adenocarcinoma EGFR +, [facility] - Very poor prognosis as discussed with pulmonary - Discussed with wife, and is to proceed with comfort measures  Acute Respiratory failure -  patient intubated for airway protection, extubated 08/16/2015, will wean oxygen as tolerated.  Hypernatremia - Secondary to volume depletion, currently  comfort care,  Acute renal failure - Discussed with wife, no IV fluids as comfort care  Hypertension  Hyperlipidemia  Code Status: DO NOT RESUSCITATE  Family Communication: His cast with wife at bedside  Disposition Plan: Awaiting palliative care consult Discussed with wife at bedside, patient has overall very poor prognosis, poor life quality, this point plan is to proceed with comfort measures only, palliative care consulted.  Procedures 1/2 admitted and intubated with acute resp failure and inability to protect airway 1/3 lumbar puncture performed 1/4 extubated  Consults   [facility] 08/18/2015 neurology  Medications  Scheduled Meds: . antiseptic oral rinse  7 mL Mouth Rinse q12n4p  . chlorhexidine  15 mL Mouth Rinse BID  . dexamethasone  6 mg Intravenous 4 times per day  . enoxaparin (LOVENOX) injection  40 mg Subcutaneous Q24H  . etomidate  20 mg Intravenous Once  . famotidine (PEPCID) IV  20 mg Intravenous Q12H  . feeding supplement (PRO-STAT SUGAR FREE 64)  60 mL Oral 5 X Daily  . levETIRAcetam  1,000 mg Intravenous Q12H  . LORazepam  1 mg Intravenous Q6H   Continuous Infusions: . sodium chloride 75 mL/hr (08/18/15 1049)  . sodium chloride 10 mL/hr at 08/16/15 1943   PRN Meds:.sodium chloride, acetaminophen, albuterol, etomidate, fentaNYL (SUBLIMAZE) injection, fentaNYL (SUBLIMAZE) injection, iohexol, midazolam, ondansetron (ZOFRAN) IV, rocuronium  DVT Prophylaxis  none  Lab Results  Component Value Date   PLT 292 08/18/2015    Antibiotics    Anti-infectives    None          Objective:   Filed Vitals:  08/17/15 1600 08/17/15 1739 08/17/15 2013 08/18/15 0432  BP: 153/92 151/92 160/92 164/95  Pulse: 118 118 120 104  Temp: 98.4 F (36.9 C) 98.5 F (36.9 C) 98.6 F (37 C) 98.4 F (36.9 C)  TempSrc: Oral Oral Axillary Oral  Resp: 20 19 20 20   Height:      Weight:    105.1 kg (231 lb 11.3 oz)  SpO2: 100% 100% 100% 100%    Wt Readings  from Last 3 Encounters:  08/18/15 105.1 kg (231 lb 11.3 oz)     Intake/Output Summary (Last 24 hours) at 08/18/15 1103 Last data filed at 08/18/15 0436  Gross per 24 hour  Intake    190 ml  Output   1350 ml  Net  -1160 ml     Physical Exam  Awake, noncommunicative, does not follow command,  Supple Neck,No JVD  Symmetrical Chest wall movement, no use of accessory muscle, no wheezing RRR,No Gallops,Rubs or new Murmurs, No Parasternal Heave +ve B.Sounds, Abd Soft, No tenderness, No organomegaly appriciated, No rebound - guarding or rigidity. No Cyanosis, Clubbing or edema, No new Rash or bruise,    Data Review   Micro Results Recent Results (from the past 240 hour(s))  MRSA PCR Screening     Status: None   Collection Time: 09/09/2015  4:18 PM  Result Value Ref Range Status   MRSA by PCR NEGATIVE NEGATIVE Final    Comment:        The GeneXpert MRSA Assay (FDA approved for NASAL specimens only), is one component of a comprehensive MRSA colonization surveillance program. It is not intended to diagnose MRSA infection nor to guide or monitor treatment for MRSA infections.   CSF culture     Status: None (Preliminary result)   Collection Time: 08/15/15  3:15 PM  Result Value Ref Range Status   Specimen Description CSF  Final   Special Requests NONE  Final   Gram Stain   Final    WBC PRESENT,BOTH PMN AND MONONUCLEAR NO ORGANISMS SEEN CYTOSPIN    Culture NO GROWTH 3 DAYS  Final   Report Status PENDING  Incomplete  Anaerobic culture     Status: None (Preliminary result)   Collection Time: 08/15/15  3:16 PM  Result Value Ref Range Status   Specimen Description CSF  Final   Special Requests NONE  Final   Gram Stain   Final    WBC PRESENT,BOTH PMN AND MONONUCLEAR NO ORGANISMS SEEN CYTOSPIN SMEAR    Culture   Final    NO ANAEROBES ISOLATED; CULTURE IN PROGRESS FOR 5 DAYS   Report Status PENDING  Incomplete    Radiology Reports Ct Head Wo Contrast  09/04/2015   ADDENDUM REPORT: 08/19/2015 13:58 ADDENDUM: These results were called by telephone at the time of interpretation on 09/08/2015 at 1:50 pm to Dr. Purnell Shoemaker, who verbally acknowledged these results. Electronically Signed   By: Vinnie Langton M.D.   On: 08/13/2015 13:58  09/02/2015  CLINICAL DATA:  70 year old male with right-sided weakness. Combative behavior. Code stroke. EXAM: CT HEAD WITHOUT CONTRAST TECHNIQUE: Contiguous axial images were obtained from the base of the skull through the vertex without intravenous contrast. COMPARISON:  No priors. FINDINGS: No acute intracranial abnormalities. Specifically, no evidence of acute intracranial hemorrhage, no definite findings of acute/subacute cerebral ischemia, no mass, mass effect, hydrocephalus or abnormal intra or extra-axial fluid collections. Visualized paranasal sinuses and mastoids are well pneumatized. No acute displaced skull fractures are identified. IMPRESSION: *No acute intracranial abnormalities. *  The appearance of the brain is normal. Electronically Signed: By: Vinnie Langton M.D. On: 08/13/2015 13:42   Mr Jeri Cos NW Contrast  08/22/2015  CLINICAL DATA:  70 year old male with hyperlipidemia presenting with fall and jerking sensation. Lung cancer. Right-sided weakness. Bilateral proptosis. Initial encounter. EXAM: MRI HEAD AND ORBITS WITHOUT AND WITH CONTRAST TECHNIQUE: Multiplanar, multiecho pulse sequences of the brain and surrounding structures were obtained without and with intravenous contrast. Multiplanar, multiecho pulse sequences of the orbits and surrounding structures were obtained including fat saturation techniques, before and after intravenous contrast administration. CONTRAST:  15m MULTIHANCE GADOBENATE DIMEGLUMINE 529 MG/ML IV SOLN COMPARISON:  08/13/2015 CT.  No comparison MR. FINDINGS: MRI HEAD FINDINGS No acute infarct. Diffuse leptomeningeal enhancement. Additionally, small scattered parenchymal enhancing lesions which may be  partially hemorrhagic or proteinaceous also noted suggesting the presence of diffuse metastatic disease given the patient's history lung cancer. In the proper clinical setting, infection or sarcoidosis could not be excluded as cause of these findings. Diffuse thickening and enhancement of the infundibulum. This is suspicious for involvement by metastatic disease. No hydrocephalus. Exophthalmos as discussed below. Mild atrophy without hydrocephalus. Pooling of secretions posterior superior pharynx. Major intracranial vascular structures are patent. Left frontal subcutaneous prominence may represent small lipoma as versus hemorrhage from recent fall. Cervical medullary junction and pineal region unremarkable. MRI ORBITS FINDINGS Marked exophthalmos with prominent retrobulbar are fat. Symmetric normal appearance of the extra-ocular muscles. No abnormal enhancement or signal intensity of the optic nerves. IMPRESSION: MRI HEAD No acute infarct. Diffuse leptomeningeal enhancement. Additionally, small scattered parenchymal enhancing lesions which may be partially hemorrhagic or proteinaceous also noted suggesting the presence of diffuse metastatic disease given the patient's history lung cancer. In the proper clinical setting, infection or sarcoidosis could not be excluded as cause of these findings. Diffuse thickening and enhancement of the infundibulum. This is suspicious for involvement by metastatic disease. Mild atrophy without hydrocephalus. MRI ORBITS Marked exophthalmos with prominent retrobulbar are fat. Symmetric normal appearance of the extra-ocular muscles. Electronically Signed   By: SGenia DelM.D.   On: 08/13/2015 16:05   Dg Chest Port 1 View  08/17/2015  CLINICAL DATA:  Acute onset of respiratory failure. Shortness of breath. Initial encounter. EXAM: PORTABLE CHEST 1 VIEW COMPARISON:  Chest radiograph performed 08/16/2015 FINDINGS: The lungs are hypoexpanded. Vascular congestion is noted, with B mildly  worsening patchy bilateral airspace opacification, left greater than right, concerning for pulmonary edema. Pneumonia could have a similar appearance. No definite pleural effusion or pneumothorax is seen. The cardiomediastinal silhouette is borderline normal in size. No acute osseous abnormalities are identified. IMPRESSION: Lungs hypoexpanded. Vascular congestion, with mildly worsening patchy bilateral airspace opacification, left greater than right, concerning for pulmonary edema. Pneumonia could have a similar appearance. Electronically Signed   By: JGarald BaldingM.D.   On: 08/17/2015 04:07   Dg Chest Port 1 View  08/16/2015  CLINICAL DATA:  Respiratory failure, acute encephalopathy, intubated patient. EXAM: PORTABLE CHEST 1 VIEW COMPARISON:  Portable chest x-ray of August 15, 2015 FINDINGS: The lungs remain hypoinflated. The interstitial markings remain coarse bilaterally. There is no pleural effusion or pneumothorax. The heart is top-normal in size. The pulmonary vascularity is not engorged. The endotracheal tube tip lies 4.3 cm above the carina. The esophagogastric tube tip projects below the inferior margin of the image. IMPRESSION: Slight worsening of hypoinflation today. Persistently increased pulmonary interstitial markings consistent with pneumonia or less likely interstitial edema. The support tubes are in reasonable position. Electronically Signed  By: David  Martinique M.D.   On: 08/16/2015 07:34   Dg Chest Port 1 View  08/16/2015  CLINICAL DATA:  New OG tube EXAM: PORTABLE CHEST 1 VIEW COMPARISON:  08/30/2015 FINDINGS: Endotracheal tube unchanged. Introduction of NG tube with tip below the margin of the film. The tube is in the course of the esophagus and stomach. Patchy bilateral airspace disease unchanged. IMPRESSION: NG tube in stomach. Electronically Signed   By: Suzy Bouchard M.D.   On: 08/16/2015 00:21   Dg Chest Portable 1 View  09/02/2015  CLINICAL DATA:  ams all day, unknown last seen  normal, intubated history of lung cancer. EXAM: PORTABLE CHEST 1 VIEW COMPARISON:  Plain film 09/07/2013 and CT of 09/10/2013 FINDINGS: Endotracheal tube terminates 4.2 cm above carina.Midline trachea. Normal heart size for level of inspiration. Right hemidiaphragm elevation is mild. No pleural effusion or pneumothorax. Similar to decreased miliary nodular pattern throughout both lungs. Slightly increased left base opacity is favored to represent atelectasis. IMPRESSION: Appropriate position of endotracheal tube. Similar to improved miliary pattern since 09/07/2013. This remains suspicious for pulmonary metastasis. Left base opacity, favoring atelectasis. Electronically Signed   By: Abigail Miyamoto M.D.   On: 09/11/2015 14:47   Mr Darnelle Catalan Wo/w Cm  09/03/2015  CLINICAL DATA:  70 year old male with hyperlipidemia presenting with fall and jerking sensation. Lung cancer. Right-sided weakness. Bilateral proptosis. Initial encounter. EXAM: MRI HEAD AND ORBITS WITHOUT AND WITH CONTRAST TECHNIQUE: Multiplanar, multiecho pulse sequences of the brain and surrounding structures were obtained without and with intravenous contrast. Multiplanar, multiecho pulse sequences of the orbits and surrounding structures were obtained including fat saturation techniques, before and after intravenous contrast administration. CONTRAST:  48m MULTIHANCE GADOBENATE DIMEGLUMINE 529 MG/ML IV SOLN COMPARISON:  08/19/2015 CT.  No comparison MR. FINDINGS: MRI HEAD FINDINGS No acute infarct. Diffuse leptomeningeal enhancement. Additionally, small scattered parenchymal enhancing lesions which may be partially hemorrhagic or proteinaceous also noted suggesting the presence of diffuse metastatic disease given the patient's history lung cancer. In the proper clinical setting, infection or sarcoidosis could not be excluded as cause of these findings. Diffuse thickening and enhancement of the infundibulum. This is suspicious for involvement by metastatic  disease. No hydrocephalus. Exophthalmos as discussed below. Mild atrophy without hydrocephalus. Pooling of secretions posterior superior pharynx. Major intracranial vascular structures are patent. Left frontal subcutaneous prominence may represent small lipoma as versus hemorrhage from recent fall. Cervical medullary junction and pineal region unremarkable. MRI ORBITS FINDINGS Marked exophthalmos with prominent retrobulbar are fat. Symmetric normal appearance of the extra-ocular muscles. No abnormal enhancement or signal intensity of the optic nerves. IMPRESSION: MRI HEAD No acute infarct. Diffuse leptomeningeal enhancement. Additionally, small scattered parenchymal enhancing lesions which may be partially hemorrhagic or proteinaceous also noted suggesting the presence of diffuse metastatic disease given the patient's history lung cancer. In the proper clinical setting, infection or sarcoidosis could not be excluded as cause of these findings. Diffuse thickening and enhancement of the infundibulum. This is suspicious for involvement by metastatic disease. Mild atrophy without hydrocephalus. MRI ORBITS Marked exophthalmos with prominent retrobulbar are fat. Symmetric normal appearance of the extra-ocular muscles. Electronically Signed   By: SGenia DelM.D.   On: 09/12/2015 16:05     CBC  Recent Labs Lab 08/26/2015 1331 08/29/2015 1336 08/15/15 0213 08/16/15 0519 08/17/15 0223 08/18/15 0326  WBC 8.8  --  17.3* 22.4* 22.5* 24.7*  HGB 14.0 16.3 13.5 13.8 14.2 14.6  HCT 42.0 48.0 41.3 41.3 42.9 44.6  PLT 233  --  236 283 239 292  MCV 84.0  --  83.8 82.8 83.8 84.8  MCH 28.0  --  27.4 27.7 27.7 27.8  MCHC 33.3  --  32.7 33.4 33.1 32.7  RDW 15.3  --  15.3 15.7* 15.6* 16.0*  LYMPHSABS 1.7  --   --   --   --   --   MONOABS 0.5  --   --   --   --   --   EOSABS 0.5  --   --   --   --   --   BASOSABS 0.0  --   --   --   --   --     Chemistries   Recent Labs Lab 08/13/2015 1331 09/01/2015 1336  08/15/15 0213 08/16/15 0519 08/17/15 0223 08/18/15 0326  NA 142 143 144 144 146* 149*  K 3.5 3.3* 3.8 3.5 4.2 3.7  CL 103 102 107 110 112* 114*  CO2 24  --  27 23 23 25   GLUCOSE 164* 158* 127* 132* 162* 119*  BUN 11 13 10 14  24* 24*  CREATININE 1.56* 1.30* 1.22 1.28* 1.32* 1.31*  CALCIUM 9.1  --  9.0 9.0 8.9 8.8*  MG  --   --  2.0  --   --   --   AST 29  --   --   --   --   --   ALT 26  --   --   --   --   --   ALKPHOS 66  --   --   --   --   --   BILITOT 0.7  --   --   --   --   --    ------------------------------------------------------------------------------------------------------------------ estimated creatinine clearance is 68.8 mL/min (by C-G formula based on Cr of 1.31). ------------------------------------------------------------------------------------------------------------------ No results for input(s): HGBA1C in the last 72 hours. ------------------------------------------------------------------------------------------------------------------ No results for input(s): CHOL, HDL, LDLCALC, TRIG, CHOLHDL, LDLDIRECT in the last 72 hours. ------------------------------------------------------------------------------------------------------------------ No results for input(s): TSH, T4TOTAL, T3FREE, THYROIDAB in the last 72 hours.  Invalid input(s): FREET3 ------------------------------------------------------------------------------------------------------------------ No results for input(s): VITAMINB12, FOLATE, FERRITIN, TIBC, IRON, RETICCTPCT in the last 72 hours.  Coagulation profile  Recent Labs Lab 08/23/2015 1331  INR 1.17    No results for input(s): DDIMER in the last 72 hours.  Cardiac Enzymes No results for input(s): CKMB, TROPONINI, MYOGLOBIN in the last 168 hours.  Invalid input(s): CK ------------------------------------------------------------------------------------------------------------------ Invalid input(s): POCBNP     Time Spent in  minutes   25 minutes   Javin Nong M.D on 08/18/2015 at 11:03 AM  Between 7am to 7pm - Pager - [phone]  After 7pm go to [email or web address] - password [facility]  [facility]   Office  [phone]

## 2015-08-18 NOTE — Progress Notes (Signed)
Pt calm with ativan, released foot restraints, ok per MD. All 4 side rails up and pt still in arm restraints with mits. Will continue to monitor.

## 2015-08-18 NOTE — Consult Note (Addendum)
Consultation Note Date: 08/18/2015   Patient Name: Nathan Wolf  DOB: 15-Jan-1946  MRN: [id number]  Age / Sex: 70 y.o., male  PCP: No primary care provider on file. 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  Family has elected for comfort care and palliative consulted for assistance with disposition and symptom management.    Contacts/Participants in Discussion: Primary Decision Maker: Patinet's wife.  We spoke via phone   Relationship to Patient wife HCPOA: None on chart  SUMMARY OF RECOMMENDATIONS - Kizzie Fantasia from our team met with patient's son.  See her note for further details. - I called and spoke with his wife via phone to follow up with family.  She confirmed that plan is for comfort care.  She understands his prognosis and would like to pursue placement at [facility] if he remains stable enough to consider transfer. - Plan for aggressive symptom management.  Orders placed with EOL order set and also plan for addition of scheduled ativan as well as PRN dosing.   - Will plan to meet with family when they are available for continued conversation and support   Code Status/Advance Care Planning: DNR    Code Status Orders        Start     Ordered   08/18/15 1736  Do not attempt resuscitation (DNR)   Continuous    Question Answer Comment  In the event of cardiac or respiratory ARREST Do not call a "code blue"   In the event of cardiac or respiratory ARREST Do not perform Intubation,  CPR, defibrillation or ACLS   In the event of cardiac or respiratory ARREST Use medication by any route, position, wound care, and other measures to relive pain and suffering. May use oxygen, suction and manual treatment of airway obstruction as needed for comfort.      08/18/15 1736    Advance Directive Documentation        Most Recent Value   Type of Advance Directive  Living will   Pre-existing out of facility DNR order (yellow form or pink MOST form)     "MOST" Form in Place?       Symptom Management:   Pain: Continue fentanyl as needed  Agitation: Plan for ativan 45m Q4hrs scheduled with additional 132mQ2hr PRN agitation.  I think there is a high possibility that we will need to consider some degree of continuous sedation and possible initiation of ativan continuous infusion if his symptoms are not able to be controlled on this regimen.  Excess secretions: Robinul as needed  Nausea: Zofran as needed  Palliative Prophylaxis:   Aspiration, Bowel Regimen and Frequent Pain Assessment  Additional Recommendations (Limitations, Scope, Preferences):  Full Comfort Care Psycho-social/Spiritual:  Support System: Strong Desire for further Chaplaincy support:Yes Additional Recommendations: Education on Hospice  Prognosis: Hours - Days. He is beginning process of actively dying.  He has brain metastasis with continued terminal agitation.  He is no longer taking PO intake and focus is on comfort care.  He has continued to display agitation and he will continue to need aggressive symptom management with high possibility of need for some degree of palliative sedation and would be best served by residential  hospice facility if he is stable enough to transition from the hospital.  Discharge Planning: Hospice facility most likely.  Discussed briefly with his wife on the phone.  She expressed interest in [facility].  Will assess tomorrow for stability for transition and discuss further with  family at that time.   Chief Complaint/ Primary Diagnoses: Present on Admission:  . Respiratory failure ([place]) . Acute encephalopathy  I have reviewed the medical record, interviewed the patient and family, and examined the patient. The following aspects are pertinent.  Past Medical History  Diagnosis Date  . PTSD (post-traumatic stress disorder)   . Hyperlipidemia   . Agitation   . Lung cancer [facility])    Social History   Social History  . Marital Status: Married    Spouse Name: N/A  . Number of Children: N/A  . Years of Education: N/A   Social History Main Topics  . Smoking status: Former Research scientist (life sciences)  . Smokeless tobacco: Never Used     Comment: QUIT SMOKING MANY YEARS AGIO   . Alcohol Use: Yes     Comment: OCCASIONAL  . Drug Use: No  . Sexual Activity: Not Asked   Other Topics Concern  . None   Social History Narrative   Family History  Problem Relation Age of Onset  . Hypertension Mother   . Hypertension Father   . Hyperlipidemia Mother   . Hyperlipidemia Father    Scheduled Meds: . dexamethasone  6 mg Intravenous 4 times per day  . levETIRAcetam  1,000 mg Intravenous Q12H  . LORazepam  1 mg Intravenous Q4H   Continuous Infusions:  PRN Meds:.acetaminophen **OR** acetaminophen, albuterol, bisacodyl, fentaNYL (SUBLIMAZE) injection, glycopyrrolate **OR** glycopyrrolate **OR** glycopyrrolate, LORazepam, ondansetron (ZOFRAN) IV Medications Prior to Admission:  Prior to Admission medications   Medication Sig Start Date End Date Taking? Authorizing Provider  amLODipine (NORVASC) 10 MG tablet Take 10 mg by mouth daily.   Yes Historical Provider, MD  atorvastatin (LIPITOR) 80 MG tablet Take 80 mg by mouth daily.   Yes Historical Provider, MD  buPROPion (ZYBAN) 150 MG 12 hr tablet Take 150 mg by mouth 2 (two) times daily.   Yes Historical Provider, MD  cetirizine (ZYRTEC) 10 MG tablet Take 10 mg by mouth daily.   Yes Historical Provider, MD  hydrochlorothiazide  (HYDRODIURIL) 25 MG tablet Take 25 mg by mouth daily.   Yes Historical Provider, MD  lisinopril (PRINIVIL,ZESTRIL) 40 MG tablet Take 40 mg by mouth daily.   Yes Historical Provider, MD  loperamide (IMODIUM) 2 MG capsule Take 2 mg by mouth as needed for diarrhea or loose stools.   Yes Historical Provider, MD  omeprazole (PRILOSEC) 20 MG capsule Take 20 mg by mouth daily.   Yes Historical Provider, MD  prochlorperazine (COMPAZINE) 5 MG tablet Take 5 mg by mouth every 6 (six) hours as needed for nausea or vomiting.   Yes Historical Provider, MD  traZODone (DESYREL) 50 MG tablet Take 50 mg by mouth at bedtime.   Yes Historical Provider, MD   No Known Allergies  Review of Systems  Unable to perform ROS: Acuity of condition    Physical Exam General: Somnolent, in no acute distress. Does not arouse.  HEENT: No bruits, no goiter, no JVD Heart: Regular rate and rhythm. No murmur appreciated. Lungs: Fair air movement, clear Abdomen: Soft, nondistended, positive bowel sounds.  Skin: Warm and dry Neuro: Unresponsive and does not interact  Vital Signs: BP 140/92 mmHg  Pulse 94  Temp(Src) 98.4 F (  36.9 C) (Axillary)  Resp 19  Ht _0  (1.88 m)  Wt 105.1 kg (231 lb 11.3 oz)  BMI 29.74 kg/m2  SpO2 98%  SpO2: SpO2: 98 % O2 Device:SpO2: 98 % O2 Flow Rate: .O2 Flow Rate (L/min): 3 L/min  IO: Intake/output summary:  Intake/Output Summary (Last 24 hours) at 08/18/15 2337 Last data filed at 08/18/15 1952  Gross per 24 hour  Intake      0 ml  Output   1550 ml  Net  -1550 ml    LBM:   Baseline Weight: Weight: 110.5 kg (243 lb 9.7 oz) Most recent weight: Weight: 105.1 kg (231 lb 11.3 oz)      Palliative Assessment/Data:  Flowsheet Rows        Most Recent Value   Intake Tab    Referral Department  Hospitalist   Unit at Time of Referral  Cardiac/Telemetry Unit   Palliative Care Primary Diagnosis  Cancer   Date Notified  08/18/15   Palliative Care Type  New Palliative care   Reason  for referral  Clarify Goals of Care   Date of Admission  09/05/2015   Date first seen by Palliative Care  08/18/15   # of days Palliative referral response time  0 Day(s)   # of days IP prior to Palliative referral  4   Clinical Assessment    Psychosocial & Spiritual Assessment    Palliative Care Outcomes       Additional Data Reviewed:  CBC:    Component Value Date/Time   WBC 24.7* 08/18/2015 0326   HGB 14.6 08/18/2015 0326   HCT 44.6 08/18/2015 0326   PLT 292 08/18/2015 0326   MCV 84.8 08/18/2015 0326   NEUTROABS 6.1 09/08/2015 1331   LYMPHSABS 1.7 08/13/2015 1331   MONOABS 0.5 09/07/2015 1331   EOSABS 0.5 08/31/2015 1331   BASOSABS 0.0 09/12/2015 1331   Comprehensive Metabolic Panel:    Component Value Date/Time   NA 149* 08/18/2015 0326   K 3.7 08/18/2015 0326   CL 114* 08/18/2015 0326   CO2 25 08/18/2015 0326   BUN 24* 08/18/2015 0326   CREATININE 1.31* 08/18/2015 0326   GLUCOSE 119* 08/18/2015 0326   CALCIUM 8.8* 08/18/2015 0326   AST 29 09/12/2015 1331   ALT 26 08/27/2015 1331   ALKPHOS 66 08/18/2015 1331   BILITOT 0.7 09/08/2015 1331   PROT 7.5 09/09/2015 1331   ALBUMIN 3.8 08/25/2015 1331     Time In: 1650 Time Out: 1730 Time Total: 40 Greater than 50%  of this time was spent counseling and coordinating care related to the above assessment and plan.  Signed by: Micheline Rough, MD  Micheline Rough, MD  08/18/2015, 11:37 PM  Please contact Palliative Medicine Team phone at [phone] for questions and concerns.

## 2015-08-18 NOTE — Care Management Important Message (Signed)
Important Message  Patient Details  Name: Nathan Wolf MRN: [id number] Date of Birth: 02-13-1946   Medicare Important Message Given:  Yes    [name] May 08/18/2015, 11:50 AM

## 2015-08-19 DIAGNOSIS — R451 Restlessness and agitation: Secondary | ICD-10-CM | POA: Insufficient documentation

## 2015-08-19 DIAGNOSIS — C7931 Secondary malignant neoplasm of brain: Secondary | ICD-10-CM | POA: Insufficient documentation

## 2015-08-19 DIAGNOSIS — Z515 Encounter for palliative care: Secondary | ICD-10-CM | POA: Insufficient documentation

## 2015-08-19 LAB — GLUCOSE, CAPILLARY
GLUCOSE-CAPILLARY: 114 mg/dL — AB (ref 65–99)
GLUCOSE-CAPILLARY: 122 mg/dL — AB (ref 65–99)
Glucose-Capillary: 123 mg/dL — ABNORMAL HIGH (ref 65–99)
Glucose-Capillary: 116 mg/dL — ABNORMAL HIGH (ref 65–99)

## 2015-08-19 LAB — CSF CULTURE W GRAM STAIN: Culture: NO GROWTH

## 2015-08-19 LAB — ANGIOTENSIN CONVERTING ENZYME, CSF: Angio Convert Enzyme: <0.4 U/L (ref 0.0–2.5)

## 2015-08-19 LAB — CSF CULTURE

## 2015-08-19 MED ORDER — DEXTROSE 5 % IV SOLN
1.0000 mg/h | INTRAVENOUS | Status: DC
  Administered 2015-08-19: 0.5 mg/h via INTRAVENOUS
  Filled 2015-08-19 (×2): qty 25

## 2015-08-19 MED ORDER — LORAZEPAM 2 MG/ML IJ SOLN: 1.0000 mg | Freq: Once | INTRAMUSCULAR | Status: DC

## 2015-08-19 MED ORDER — LORAZEPAM 2 MG/ML IJ SOLN
2.0000 mg | Freq: Once | INTRAMUSCULAR | Status: AC
  Administered 2015-08-19: 2 mg via INTRAVENOUS
  Filled 2015-08-19: qty 1

## 2015-08-19 NOTE — Progress Notes (Signed)
Patient Demographics  Nathan Wolf, is a 70 y.o. male, DOB - 23-Mar-1946, QGB:[id number]  Admit date - 09/06/2015   Admitting Physician Juanito Doom, MD  Outpatient Primary MD for the patient is No primary care provider on file.  LOS - 5   Chief Complaint  Patient presents with  . 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  Subjective:   Nathan Wolf is noncommunicative, cannot provide any complaints.  Assessment & Plan    Active Problems:   Respiratory failure (HCC)   Acute encephalopathy   Acute respiratory failure (HCC)   Altered awareness, transient   Endotracheally intubated   Encounter for orogastric (OG) tube placement  Acute encephalopathy - Differential diagnoses include postictal versus intracranial metastasis(brain metastases / leptomeningeal disease) - Remains encephalopathic, agitated . - Continue with Keppra at current dose to prevent recurrent seizures. - Palliative care consult appreciated, and is currently comfort measures, measurement per palliative care, patient currently on scheduled Ativan, as well when necessary dosing given agitation.   Lung cancer - NSCLC, adenocarcinoma EGFR +, [facility] - Very poor prognosis as discussed with pulmonary - Agent is currently comfort measure  Acute  Respiratory failure -  patient intubated for airway protection, extubated 08/16/2015,  wean oxygen as tolerated.  Hypernatremia - Secondary to volume depletion, currently comfort care, no further labs  Acute renal failure - Discussed with wife, no IV fluids as comfort care, no further labs  Hypertension  Hyperlipidemia  Code Status: DO NOT RESUSCITATE/comfort measures  Family Communication: None at bedside today  Disposition Plan: Patient is currently comfort measures, will discuss with palliative care  Procedures 1/2 admitted and intubated with acute resp failure and inability to protect airway 1/3 lumbar puncture performed 1/4 extubated  Consults   [facility] >>[facility] 08/18/2015 neurology  Medications  Scheduled Meds: . dexamethasone  6 mg Intravenous 4 times per day  . levETIRAcetam  1,000 mg Intravenous Q12H  . LORazepam  2 mg Intravenous Once   Continuous Infusions: . LORazepam (ATIVAN) infusion     PRN Meds:.acetaminophen **OR** acetaminophen, albuterol, bisacodyl, fentaNYL (SUBLIMAZE) injection, glycopyrrolate **OR** glycopyrrolate **OR** glycopyrrolate, LORazepam, ondansetron (ZOFRAN) IV  DVT Prophylaxis  none  Lab Results  Component Value Date   PLT 292 08/18/2015    Antibiotics    Anti-infectives    None          Objective:   Filed Vitals:   08/18/15 0432 08/18/15 1514 08/18/15 2213 08/19/15 0632  BP: 164/95 141/89 140/92 133/99  Pulse: 104 91 94 54  Temp: 98.4 F (36.9 C) 97.5 F (36.4 C) 98.4 F (36.9 C) 98 F (36.7 C)  TempSrc: Oral Axillary Axillary Oral  Resp: 20 17 19 18   Height:      Weight: 105.1 kg (231 lb  11.3 oz)     SpO2: 100% 100% 98% 97%    Wt Readings from Last 3 Encounters:  08/18/15 105.1 kg (231 lb 11.3 oz)     Intake/Output Summary (Last 24 hours) at 08/19/15 1107 Last data filed at 08/19/15 8242  Gross per 24 hour  Intake      0 ml  Output   1700 ml  Net  -1700 ml     Physical Exam  Lethargic,  noncommunicative Supple Neck,No JVD  Symmetrical Chest wall movement, no use of accessory muscle, no wheezing No Gallops,Rubs or new Murmurs. +ve B.Sounds, Abd Soft, No tenderness, No organomegaly appriciated. No Cyanosis, Clubbing or edema, No new Rash or bruise,    Data Review   Micro Results Recent Results (from the past 240 hour(s))  MRSA PCR Screening     Status: None   Collection Time: 08/18/2015  4:18 PM  Result Value Ref Range Status   MRSA by PCR NEGATIVE NEGATIVE Final    Comment:        The GeneXpert MRSA Assay (FDA approved for NASAL specimens only), is one component of a comprehensive MRSA colonization surveillance program. It is not intended to diagnose MRSA infection nor to guide or monitor treatment for MRSA infections.   CSF culture     Status: None   Collection Time: 08/15/15  3:15 PM  Result Value Ref Range Status   Specimen Description CSF  Final   Special Requests NONE  Final   Gram Stain   Final    WBC PRESENT,BOTH PMN AND MONONUCLEAR NO ORGANISMS SEEN CYTOSPIN    Culture NO GROWTH 3 DAYS  Final   Report Status 08/19/2015 FINAL  Final  Anaerobic culture     Status: None (Preliminary result)   Collection Time: 08/15/15  3:16 PM  Result Value Ref Range Status   Specimen Description CSF  Final   Special Requests NONE  Final   Gram Stain   Final    WBC PRESENT,BOTH PMN AND MONONUCLEAR NO ORGANISMS SEEN CYTOSPIN SMEAR    Culture   Final    NO ANAEROBES ISOLATED; CULTURE IN PROGRESS FOR 5 DAYS   Report Status PENDING  Incomplete    Radiology Reports Ct Head Wo Contrast  09/02/2015  ADDENDUM REPORT: 08/24/2015 13:58 ADDENDUM: These results were called by telephone at the time of interpretation on 09/10/2015 at 1:50 pm to Dr. Purnell Shoemaker, who verbally acknowledged these results. Electronically Signed   By: Vinnie Langton M.D.   On: 08/18/2015 13:58  09/03/2015  CLINICAL DATA:  70 year old male with right-sided weakness. Combative behavior. Code stroke.  EXAM: CT HEAD WITHOUT CONTRAST TECHNIQUE: Contiguous axial images were obtained from the base of the skull through the vertex without intravenous contrast. COMPARISON:  No priors. FINDINGS: No acute intracranial abnormalities. Specifically, no evidence of acute intracranial hemorrhage, no definite findings of acute/subacute cerebral ischemia, no mass, mass effect, hydrocephalus or abnormal intra or extra-axial fluid collections. Visualized paranasal sinuses and mastoids are well pneumatized. No acute displaced skull fractures are identified. IMPRESSION: *No acute intracranial abnormalities. *The appearance of the brain is normal. Electronically Signed: By: Vinnie Langton M.D. On: 08/24/2015 13:42   Mr Jeri Cos PN Contrast  08/19/2015  CLINICAL DATA:  70 year old male with hyperlipidemia presenting with fall and jerking sensation. Lung cancer. Right-sided weakness. Bilateral proptosis. Initial encounter. EXAM: MRI HEAD AND ORBITS WITHOUT AND WITH CONTRAST TECHNIQUE: Multiplanar, multiecho pulse sequences of the brain and surrounding structures were obtained without and with intravenous contrast.  Multiplanar, multiecho pulse sequences of the orbits and surrounding structures were obtained including fat saturation techniques, before and after intravenous contrast administration. CONTRAST:  72m MULTIHANCE GADOBENATE DIMEGLUMINE 529 MG/ML IV SOLN COMPARISON:  08/25/2015 CT.  No comparison MR. FINDINGS: MRI HEAD FINDINGS No acute infarct. Diffuse leptomeningeal enhancement. Additionally, small scattered parenchymal enhancing lesions which may be partially hemorrhagic or proteinaceous also noted suggesting the presence of diffuse metastatic disease given the patient's history lung cancer. In the proper clinical setting, infection or sarcoidosis could not be excluded as cause of these findings. Diffuse thickening and enhancement of the infundibulum. This is suspicious for involvement by metastatic disease. No  hydrocephalus. Exophthalmos as discussed below. Mild atrophy without hydrocephalus. Pooling of secretions posterior superior pharynx. Major intracranial vascular structures are patent. Left frontal subcutaneous prominence may represent small lipoma as versus hemorrhage from recent fall. Cervical medullary junction and pineal region unremarkable. MRI ORBITS FINDINGS Marked exophthalmos with prominent retrobulbar are fat. Symmetric normal appearance of the extra-ocular muscles. No abnormal enhancement or signal intensity of the optic nerves. IMPRESSION: MRI HEAD No acute infarct. Diffuse leptomeningeal enhancement. Additionally, small scattered parenchymal enhancing lesions which may be partially hemorrhagic or proteinaceous also noted suggesting the presence of diffuse metastatic disease given the patient's history lung cancer. In the proper clinical setting, infection or sarcoidosis could not be excluded as cause of these findings. Diffuse thickening and enhancement of the infundibulum. This is suspicious for involvement by metastatic disease. Mild atrophy without hydrocephalus. MRI ORBITS Marked exophthalmos with prominent retrobulbar are fat. Symmetric normal appearance of the extra-ocular muscles. Electronically Signed   By: SGenia DelM.D.   On: 08/13/2015 16:05   Dg Chest Port 1 View  08/17/2015  CLINICAL DATA:  Acute onset of respiratory failure. Shortness of breath. Initial encounter. EXAM: PORTABLE CHEST 1 VIEW COMPARISON:  Chest radiograph performed 08/16/2015 FINDINGS: The lungs are hypoexpanded. Vascular congestion is noted, with B mildly worsening patchy bilateral airspace opacification, left greater than right, concerning for pulmonary edema. Pneumonia could have a similar appearance. No definite pleural effusion or pneumothorax is seen. The cardiomediastinal silhouette is borderline normal in size. No acute osseous abnormalities are identified. IMPRESSION: Lungs hypoexpanded. Vascular congestion,  with mildly worsening patchy bilateral airspace opacification, left greater than right, concerning for pulmonary edema. Pneumonia could have a similar appearance. Electronically Signed   By: JGarald BaldingM.D.   On: 08/17/2015 04:07   Dg Chest Port 1 View  08/16/2015  CLINICAL DATA:  Respiratory failure, acute encephalopathy, intubated patient. EXAM: PORTABLE CHEST 1 VIEW COMPARISON:  Portable chest x-ray of August 15, 2015 FINDINGS: The lungs remain hypoinflated. The interstitial markings remain coarse bilaterally. There is no pleural effusion or pneumothorax. The heart is top-normal in size. The pulmonary vascularity is not engorged. The endotracheal tube tip lies 4.3 cm above the carina. The esophagogastric tube tip projects below the inferior margin of the image. IMPRESSION: Slight worsening of hypoinflation today. Persistently increased pulmonary interstitial markings consistent with pneumonia or less likely interstitial edema. The support tubes are in reasonable position. Electronically Signed   By: David  JMartiniqueM.D.   On: 08/16/2015 07:34   Dg Chest Port 1 View  08/16/2015  CLINICAL DATA:  New OG tube EXAM: PORTABLE CHEST 1 VIEW COMPARISON:  09/05/2015 FINDINGS: Endotracheal tube unchanged. Introduction of NG tube with tip below the margin of the film. The tube is in the course of the esophagus and stomach. Patchy bilateral airspace disease unchanged. IMPRESSION: NG tube in stomach. Electronically Signed  By: Suzy Bouchard M.D.   On: 08/16/2015 00:21   Dg Chest Portable 1 View  08/18/2015  CLINICAL DATA:  ams all day, unknown last seen normal, intubated history of lung cancer. EXAM: PORTABLE CHEST 1 VIEW COMPARISON:  Plain film 09/07/2013 and CT of 09/10/2013 FINDINGS: Endotracheal tube terminates 4.2 cm above carina.Midline trachea. Normal heart size for level of inspiration. Right hemidiaphragm elevation is mild. No pleural effusion or pneumothorax. Similar to decreased miliary nodular pattern  throughout both lungs. Slightly increased left base opacity is favored to represent atelectasis. IMPRESSION: Appropriate position of endotracheal tube. Similar to improved miliary pattern since 09/07/2013. This remains suspicious for pulmonary metastasis. Left base opacity, favoring atelectasis. Electronically Signed   By: Abigail Miyamoto M.D.   On: 08/16/2015 14:47   Mr Darnelle Catalan Wo/w Cm  09/11/2015  CLINICAL DATA:  70 year old male with hyperlipidemia presenting with fall and jerking sensation. Lung cancer. Right-sided weakness. Bilateral proptosis. Initial encounter. EXAM: MRI HEAD AND ORBITS WITHOUT AND WITH CONTRAST TECHNIQUE: Multiplanar, multiecho pulse sequences of the brain and surrounding structures were obtained without and with intravenous contrast. Multiplanar, multiecho pulse sequences of the orbits and surrounding structures were obtained including fat saturation techniques, before and after intravenous contrast administration. CONTRAST:  28m MULTIHANCE GADOBENATE DIMEGLUMINE 529 MG/ML IV SOLN COMPARISON:  08/17/2015 CT.  No comparison MR. FINDINGS: MRI HEAD FINDINGS No acute infarct. Diffuse leptomeningeal enhancement. Additionally, small scattered parenchymal enhancing lesions which may be partially hemorrhagic or proteinaceous also noted suggesting the presence of diffuse metastatic disease given the patient's history lung cancer. In the proper clinical setting, infection or sarcoidosis could not be excluded as cause of these findings. Diffuse thickening and enhancement of the infundibulum. This is suspicious for involvement by metastatic disease. No hydrocephalus. Exophthalmos as discussed below. Mild atrophy without hydrocephalus. Pooling of secretions posterior superior pharynx. Major intracranial vascular structures are patent. Left frontal subcutaneous prominence may represent small lipoma as versus hemorrhage from recent fall. Cervical medullary junction and pineal region unremarkable. MRI  ORBITS FINDINGS Marked exophthalmos with prominent retrobulbar are fat. Symmetric normal appearance of the extra-ocular muscles. No abnormal enhancement or signal intensity of the optic nerves. IMPRESSION: MRI HEAD No acute infarct. Diffuse leptomeningeal enhancement. Additionally, small scattered parenchymal enhancing lesions which may be partially hemorrhagic or proteinaceous also noted suggesting the presence of diffuse metastatic disease given the patient's history lung cancer. In the proper clinical setting, infection or sarcoidosis could not be excluded as cause of these findings. Diffuse thickening and enhancement of the infundibulum. This is suspicious for involvement by metastatic disease. Mild atrophy without hydrocephalus. MRI ORBITS Marked exophthalmos with prominent retrobulbar are fat. Symmetric normal appearance of the extra-ocular muscles. Electronically Signed   By: SGenia DelM.D.   On: 08/27/2015 16:05     CBC  Recent Labs Lab 08/23/2015 1331 08/25/2015 1336 08/15/15 0213 08/16/15 0519 08/17/15 0223 08/18/15 0326  WBC 8.8  --  17.3* 22.4* 22.5* 24.7*  HGB 14.0 16.3 13.5 13.8 14.2 14.6  HCT 42.0 48.0 41.3 41.3 42.9 44.6  PLT 233  --  236 283 239 292  MCV 84.0  --  83.8 82.8 83.8 84.8  MCH 28.0  --  27.4 27.7 27.7 27.8  MCHC 33.3  --  32.7 33.4 33.1 32.7  RDW 15.3  --  15.3 15.7* 15.6* 16.0*  LYMPHSABS 1.7  --   --   --   --   --   MONOABS 0.5  --   --   --   --   --  EOSABS 0.5  --   --   --   --   --   BASOSABS 0.0  --   --   --   --   --     Chemistries   Recent Labs Lab 09/05/2015 1331 09/01/2015 1336 08/15/15 0213 08/16/15 0519 08/17/15 0223 08/18/15 0326  NA 142 143 144 144 146* 149*  K 3.5 3.3* 3.8 3.5 4.2 3.7  CL 103 102 107 110 112* 114*  CO2 24  --  27 23 23 25   GLUCOSE 164* 158* 127* 132* 162* 119*  BUN 11 13 10 14  24* 24*  CREATININE 1.56* 1.30* 1.22 1.28* 1.32* 1.31*  CALCIUM 9.1  --  9.0 9.0 8.9 8.8*  MG  --   --  2.0  --   --   --   AST 29  --    --   --   --   --   ALT 26  --   --   --   --   --   ALKPHOS 66  --   --   --   --   --   BILITOT 0.7  --   --   --   --   --    ------------------------------------------------------------------------------------------------------------------ estimated creatinine clearance is 68.8 mL/min (by C-G formula based on Cr of 1.31). ------------------------------------------------------------------------------------------------------------------ No results for input(s): HGBA1C in the last 72 hours. ------------------------------------------------------------------------------------------------------------------ No results for input(s): CHOL, HDL, LDLCALC, TRIG, CHOLHDL, LDLDIRECT in the last 72 hours. ------------------------------------------------------------------------------------------------------------------ No results for input(s): TSH, T4TOTAL, T3FREE, THYROIDAB in the last 72 hours.  Invalid input(s): FREET3 ------------------------------------------------------------------------------------------------------------------ No results for input(s): VITAMINB12, FOLATE, FERRITIN, TIBC, IRON, RETICCTPCT in the last 72 hours.  Coagulation profile  Recent Labs Lab 08/28/2015 1331  INR 1.17    No results for input(s): DDIMER in the last 72 hours.  Cardiac Enzymes No results for input(s): CKMB, TROPONINI, MYOGLOBIN in the last 168 hours.  Invalid input(s): CK ------------------------------------------------------------------------------------------------------------------ Invalid input(s): POCBNP     Time Spent in minutes   20 minutes   Thedford Bunton M.D on 08/19/2015 at 11:07 AM  Between 7am to 7pm - Pager - [phone]  After 7pm go to [email or web address] - password [facility]  [facility]   Office  [phone]

## 2015-08-19 NOTE — Progress Notes (Signed)
Daily Progress Note   Patient Name: Nathan Wolf       Date: 08/19/2015 DOB: Jul 01, 1946  Age: 70 y.o. MRN#: [id number] Attending Physician: Albertine Patricia, MD Primary Care Physician: No primary care provider on file. Admit Date: 08/13/2015  Reason for Consultation/Follow-up: Disposition, Establishing goals of care, Hospice Evaluation, Non pain symptom management and Pain control  Subjective: Patient seen and examined this AM.  Restless and agitated, but not interactive during exam.  Discussed with his bedside nurse who reports that he calms for a short period after receiving Ativan, however restlessness and agitation returns within an hour.  Length of Stay: 5 days  Current Medications: Scheduled Meds:  . dexamethasone  6 mg Intravenous 4 times per day  . levETIRAcetam  1,000 mg Intravenous Q12H    Continuous Infusions: . LORazepam (ATIVAN) infusion 1 mg/hr (08/19/15 1656)    PRN Meds: acetaminophen **OR** acetaminophen, albuterol, bisacodyl, fentaNYL (SUBLIMAZE) injection, glycopyrrolate **OR** glycopyrrolate **OR** glycopyrrolate, LORazepam, ondansetron (ZOFRAN) IV  Physical Exam: Physical Exam            General: Not interactive but restless and moving around in bed.  Agitated.  HEENT: No bruits, no goiter, no JVD Heart: Regular rate and rhythm. No murmur appreciated. Lungs: Fair air movement, clear Abdomen: Soft, nondistended, positive bowel sounds.  Skin: Warm and dry Neuro: Unresponsive and does not interact   Vital Signs: BP 130/87 mmHg  Pulse 112  Temp(Src) 98.2 F (36.8 C) (Axillary)  Resp 19  Ht '6\' 2"'$  (1.88 m)  Wt 105.1 kg (231 lb 11.3 oz)  BMI 29.74 kg/m2  SpO2 96% SpO2: SpO2: 96 % O2 Device: O2 Device: Not Delivered O2 Flow Rate: O2 Flow Rate  (L/min): 3 L/min  Intake/output summary:  Intake/Output Summary (Last 24 hours) at 08/19/15 2236 Last data filed at 08/19/15 1911  Gross per 24 hour  Intake      0 ml  Output   1550 ml  Net  -1550 ml   LBM:   Baseline Weight: Weight: 110.5 kg (243 lb 9.7 oz) Most recent weight: Weight: 105.1 kg (231 lb 11.3 oz)       Palliative Assessment/Data: Flowsheet Rows        Most Recent Value   Intake Tab    Referral Department  Hospitalist   Unit  at Time of Referral  Cardiac/Telemetry Unit   Palliative Care Primary Diagnosis  Cancer   Date Notified  08/18/15   Palliative Care Type  New Palliative care   Reason for referral  Clarify Goals of Care   Date of Admission  09/01/2015   Date first seen by Palliative Care  08/18/15   # of days Palliative referral response time  0 Day(s)   # of days IP prior to Palliative referral  4   Clinical Assessment    Psychosocial & Spiritual Assessment    Palliative Care Outcomes       Additional Data Reviewed: CBC    Component Value Date/Time   WBC 24.7* 08/18/2015 0326   RBC 5.26 08/18/2015 0326   HGB 14.6 08/18/2015 0326   HCT 44.6 08/18/2015 0326   PLT 292 08/18/2015 0326   MCV 84.8 08/18/2015 0326   MCH 27.8 08/18/2015 0326   MCHC 32.7 08/18/2015 0326   RDW 16.0* 08/18/2015 0326   LYMPHSABS 1.7 09/12/2015 1331   MONOABS 0.5 08/24/2015 1331   EOSABS 0.5 08/13/2015 1331   BASOSABS 0.0 08/25/2015 1331    CMP     Component Value Date/Time   NA 149* 08/18/2015 0326   K 3.7 08/18/2015 0326   CL 114* 08/18/2015 0326   CO2 25 08/18/2015 0326   GLUCOSE 119* 08/18/2015 0326   BUN 24* 08/18/2015 0326   CREATININE 1.31* 08/18/2015 0326   CALCIUM 8.8* 08/18/2015 0326   PROT 7.5 09/07/2015 1331   ALBUMIN 3.8 08/30/2015 1331   AST 29 08/17/2015 1331   ALT 26 09/06/2015 1331   ALKPHOS 66 09/09/2015 1331   BILITOT 0.7 09/01/2015 1331   GFRNONAA 54* 08/18/2015 0326   GFRAA >60 08/18/2015 0326       Problem List:  Patient Active  Problem List   Diagnosis Date Noted  . Encounter for orogastric (OG) tube placement   . Acute respiratory failure ([place])   . Altered awareness, transient   . Endotracheally intubated   . Respiratory failure ([place]) 09/07/2015  . Acute encephalopathy 09/01/2015     Palliative Care Assessment & Plan    1.Code Status:  DNR    Code Status Orders        Start     Ordered   08/18/15 1736  Do not attempt resuscitation (DNR)   Continuous    Question Answer Comment  In the event of cardiac or respiratory ARREST Do not call a "code blue"   In the event of cardiac or respiratory ARREST Do not perform Intubation, CPR, defibrillation or ACLS   In the event of cardiac or respiratory ARREST Use medication by any route, position, wound care, and other measures to relive pain and suffering. May use oxygen, suction and manual treatment of airway obstruction as needed for comfort.      08/18/15 1736    Advance Directive Documentation        Most Recent Value   Type of Advance Directive  Living will   Pre-existing out of facility DNR order (yellow form or pink MOST form)     "MOST" Form in Place?       Symptom management  Pain: Continue fentanyl as needed  Agitation: Patient remained agitated on intermittent IV ativan this morning.  Called and discussed at length regarding increasing sedation with continuous infusion with his wife and she is in agreement that he would best be served by continuous infusion ativan.  Started on 0.'5mg'$  and reassessed several hours  later.  Increased to '1mg'$ /hr.  Continue to follow.    Excess secretions: Robinul as needed  Nausea: Zofran as needed  Discharge Planning:  Hospice facility   Care plan was discussed with Patient's wife, bedside nurse, Dr. Waldron Labs  Thank you for allowing the Palliative Medicine Team to assist in the care of this patient.   Time In: 1020 Time Out: 1100 Total Time 40 Prolonged Time Billed  No        Micheline Rough, MD    08/19/2015, 10:36 PM  Please contact Palliative Medicine Team phone at [phone] for questions and concerns.

## 2015-08-19 NOTE — Clinical Social Work Note (Signed)
CSW contacted [employer] [facility] to give referral for residential hospice.  Spoke to [place] at [phone] from [facility], who said there is not a bed available today, but possibly tomorrow.  Lattie Haw stated she will update the weekend social worker after they have completed evaluation on patient.  CSW to continue to provide psychosocial support for patient and his family.  Jones Broom. [place], MSW, [place] 08/19/2015 2:21 PM

## 2015-08-20 DIAGNOSIS — C7931 Secondary malignant neoplasm of brain: Secondary | ICD-10-CM

## 2015-08-21 LAB — ANAEROBIC CULTURE

## 2015-09-13 NOTE — Progress Notes (Signed)
09/13/15 2:37 PM [facility] notified @ [phone].  Representatives name is Wendall Mola.  Reference # is [id number].  Pt has ruled out for donation. [place]

## 2015-09-13 NOTE — Progress Notes (Signed)
Remaining ativan in IV bag was wasted in sink of pt's room. Witness was Jeneen Rinks, Therapist, sports.  Grant Fontana RN, BSN

## 2015-09-13 NOTE — Progress Notes (Signed)
Post-mortem care was performed and pt was taken to the morgue.  Grant Fontana RN, BSN

## 2015-09-13 NOTE — Accreditation Note (Signed)
o Restraints reported to CMS  Pursuant to regulation 482.13 (G) (3) use of restraints was logged and CMS was notified via email on 01.10.2017 at [phone] by Evette Cristal, RN, Patient Safety and Accreditation.

## 2015-09-13 NOTE — Progress Notes (Addendum)
2015/09/05 2:13 PM Pt without BP, Pulse and respirations x 1 minute @ 1345.  Verified by 2 nurses, Carney Corners RN and Grant Fontana RN.  Attending MD,  Dr. Waldron Labs notified, orders to pronounce given.   Wife, Fontaine Gruber, notified of husband's death and gave [place] permission to release pt. to [facility].   [facility] to be notified.  Post Mortem care and documentation to follow. Carney Corners

## 2015-09-13 NOTE — Progress Notes (Signed)
Restraints have been removed per MD order. Will continue to monitor.  Grant Fontana RN, BSN

## 2015-09-13 NOTE — Discharge Summary (Signed)
Brief death summary: - date of death September 09, 2015, time of death 13:45  Cause of death: - Cardio pulmonary arrest Secondary to: - Metastatic lung cancer - Brain metastasis  Hospital course: for detailed hospital course please refer to his note dictated by me on 08/19/2015.  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  Atha Starks MD

## 2015-09-13 DEATH — deceased

## 2016-06-16 IMAGING — CT CT HEAD W/O CM
2 series · 16 of 30 positions shown, 20 images · non-contrast
Comparison: No priors.

ADDENDUM:
These results were called by telephone at the time of interpretation
on 08/14/2015 at [DATE] to Dr. Kai Cheong, who verbally acknowledged
these results.
CLINICAL DATA: 69-year-old male with right-sided weakness.
Combative behavior. Code stroke.

EXAM:
CT HEAD WITHOUT CONTRAST
TECHNIQUE: Contiguous axial images were obtained from the base of the skull
through the vertex without intravenous contrast.

[Series 201: head w/o, idose (1) · axial · non-contrast · 0.41mm/px · z∈[+36,+166]mm · 13 of 32 slices shown, 17 images]
[im 3/32  brain]
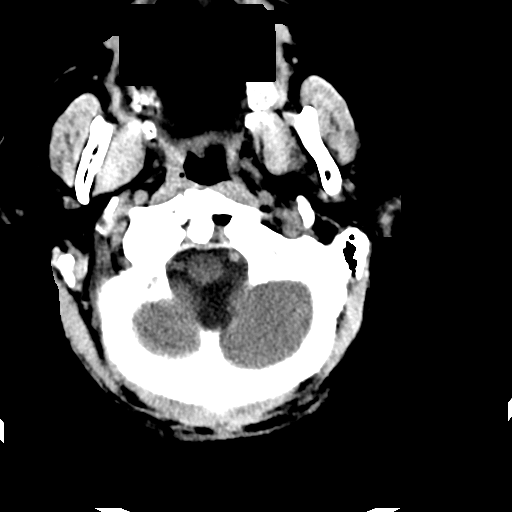
[im 3/32  bone]
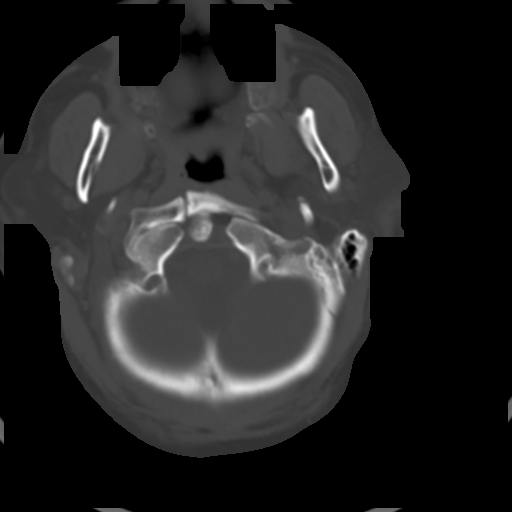
[im 5/32  brain]
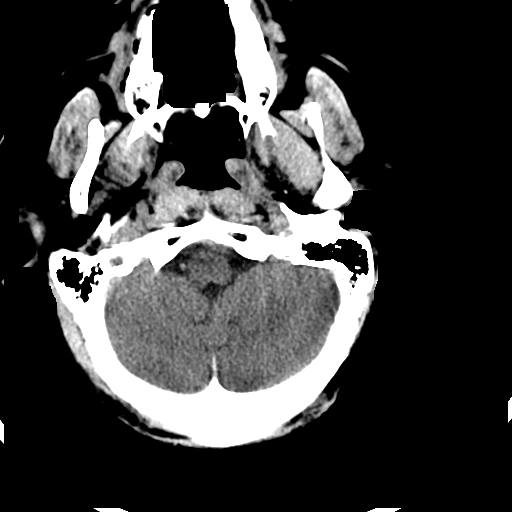
[im 7/32  brain]
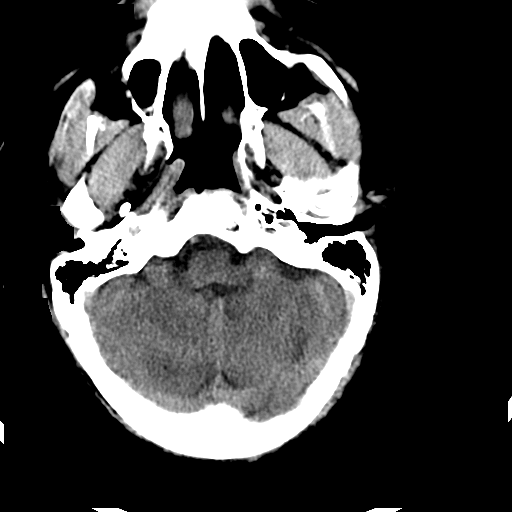
[im 9/32  brain]
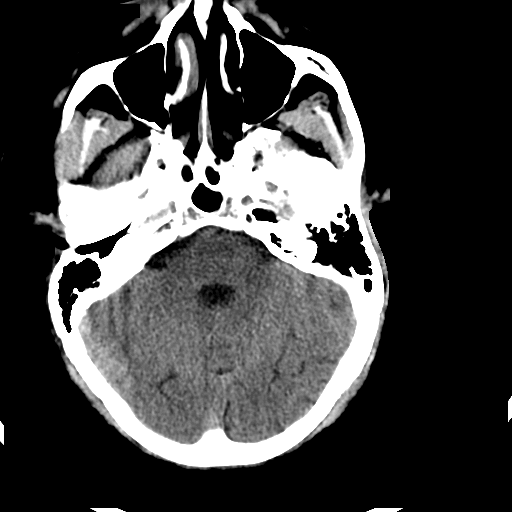
[im 12/32  brain]
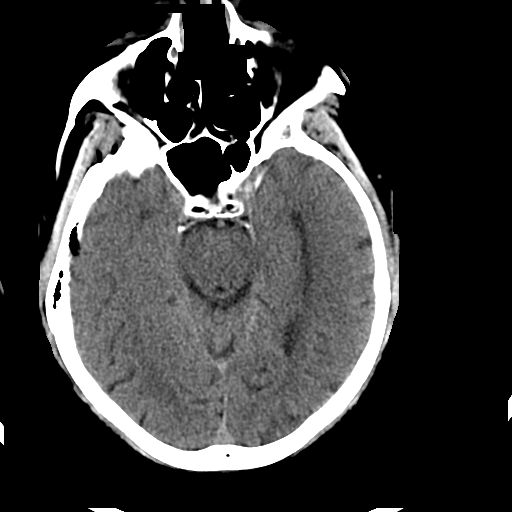
[im 12/32  bone]
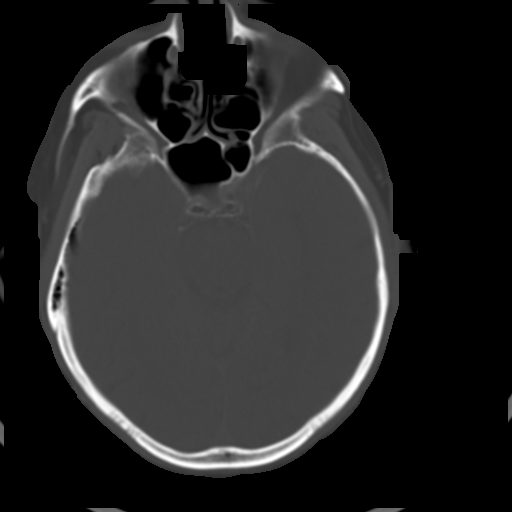
[im 14/32  brain]
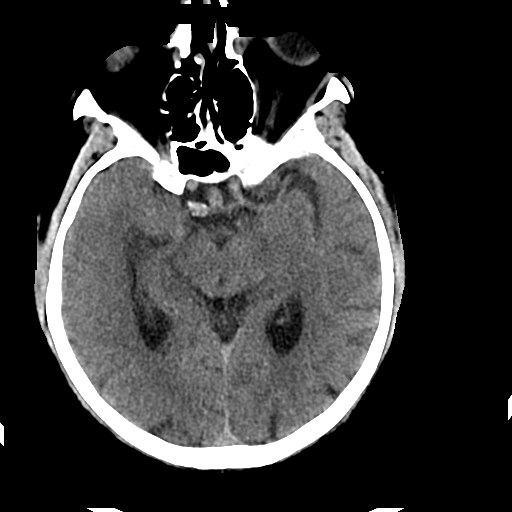
[im 16/32  brain]
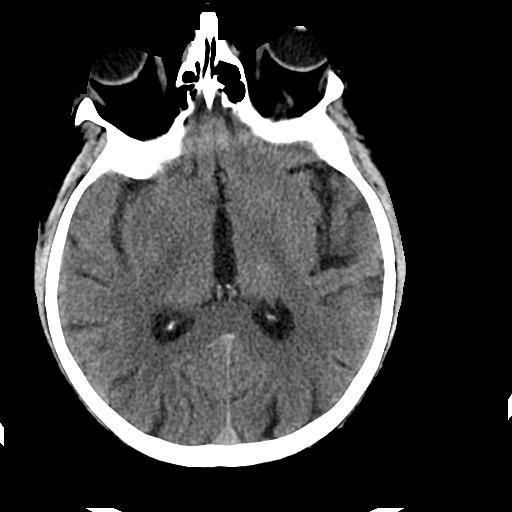
[im 18/32  brain]
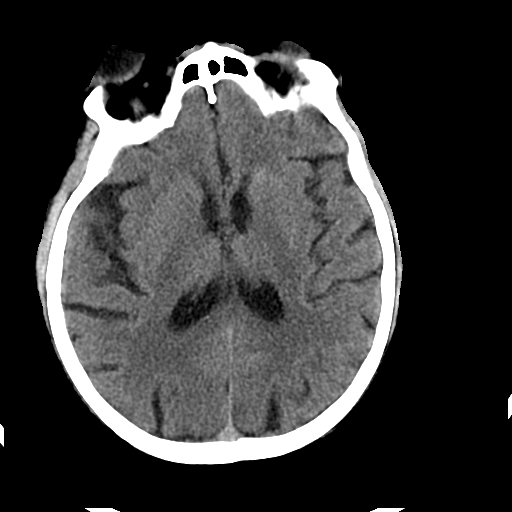
[im 20/32  brain]
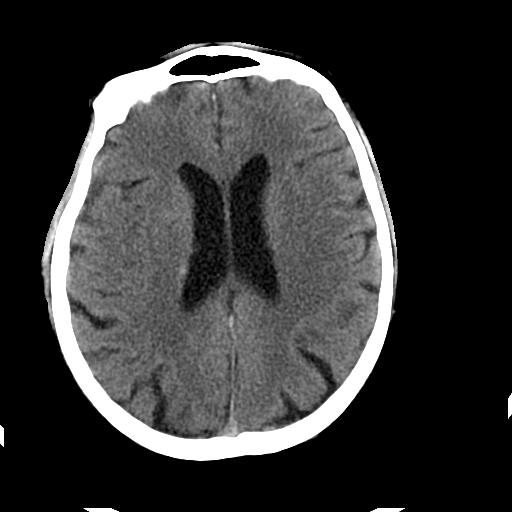
[im 20/32  bone]
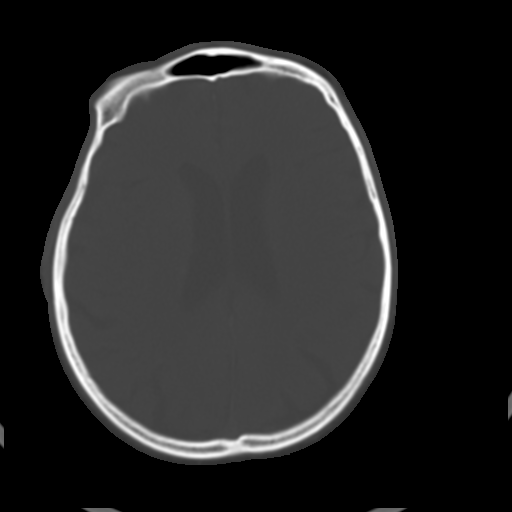
[im 23/32  brain]
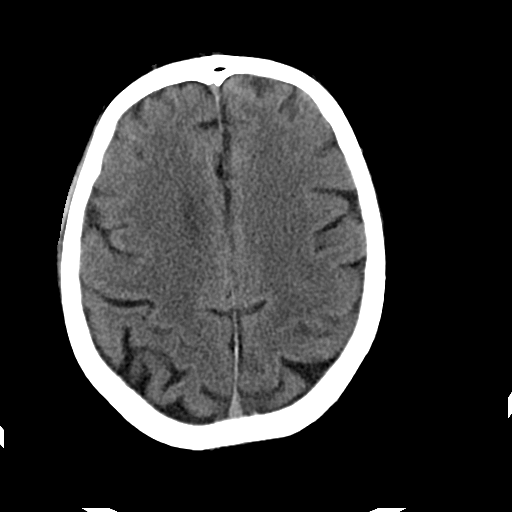
[im 25/32  brain]
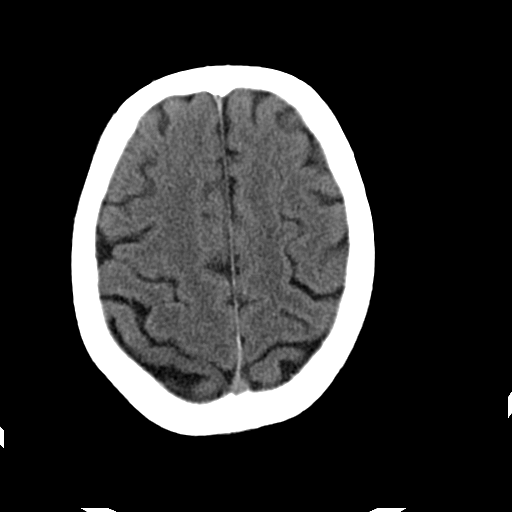
[im 27/32  brain]
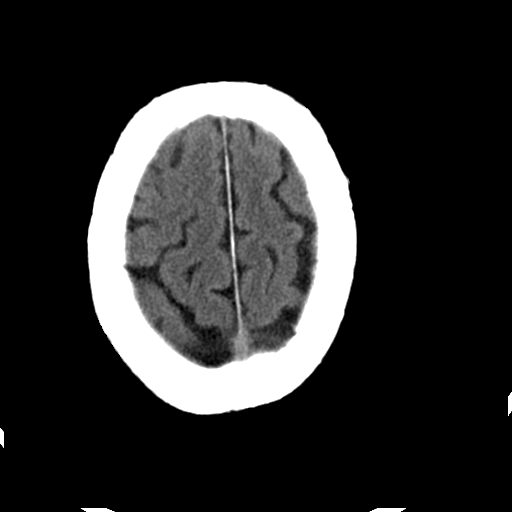
[im 29/32  brain]
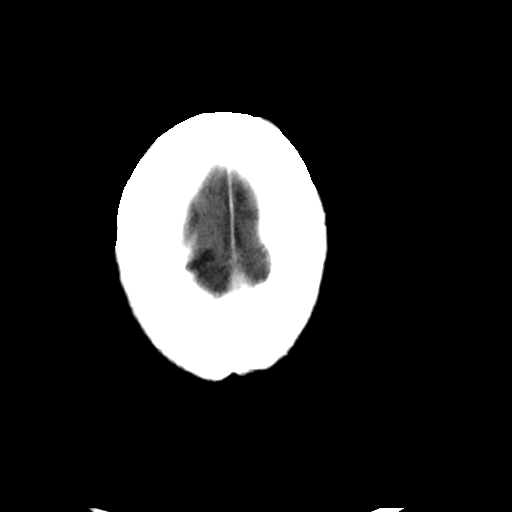
[im 29/32  bone]
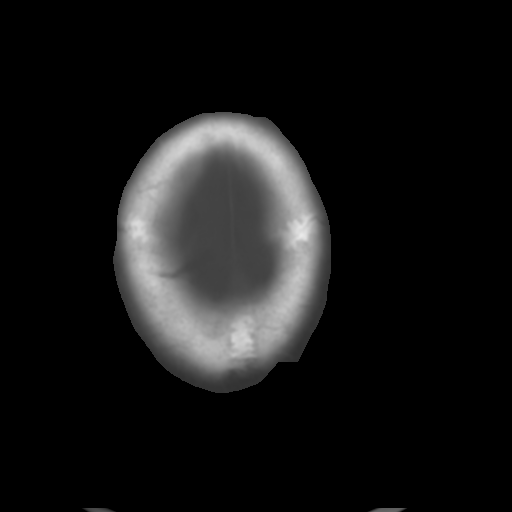

[Series 202: head w/o bone, idose (1) · axial · non-contrast · 0.41mm/px · z∈[+36,+81]mm · 3 of 32 slices shown]
[im 3/32  bone]
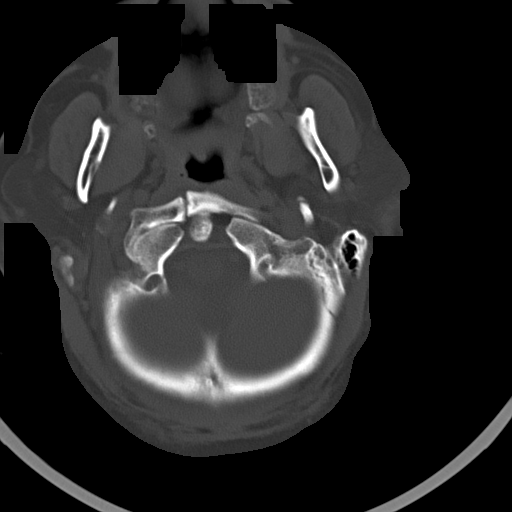
[im 7/32  bone]
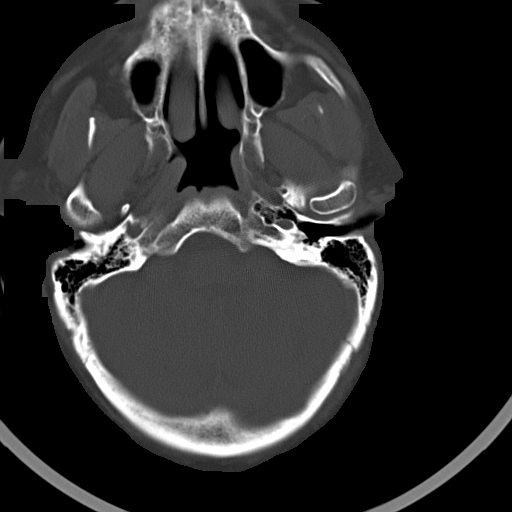
[im 12/32  bone]
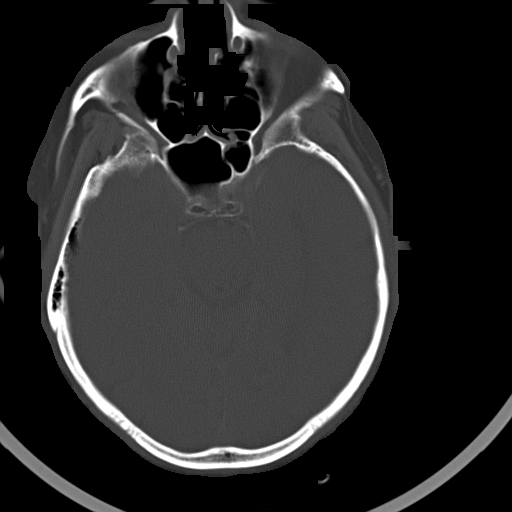

[16 of 30 positions shown; findings below may reference images not displayed]

FINDINGS: No acute intracranial abnormalities. Specifically, no evidence of
acute intracranial hemorrhage, no definite findings of
acute/subacute cerebral ischemia, no mass, mass effect,
hydrocephalus or abnormal intra or extra-axial fluid collections.
Visualized paranasal sinuses and mastoids are well pneumatized. No
acute displaced skull fractures are identified.
IMPRESSION: *No acute intracranial abnormalities.
*The appearance of the brain is normal.

## 2016-06-17 IMAGING — CR DG CHEST 1V PORT
1 series · 1 of 1 positions shown · non-contrast
Comparison: 08/14/2015

CLINICAL DATA: New OG tube

EXAM:
PORTABLE CHEST 1 VIEW

[AP]
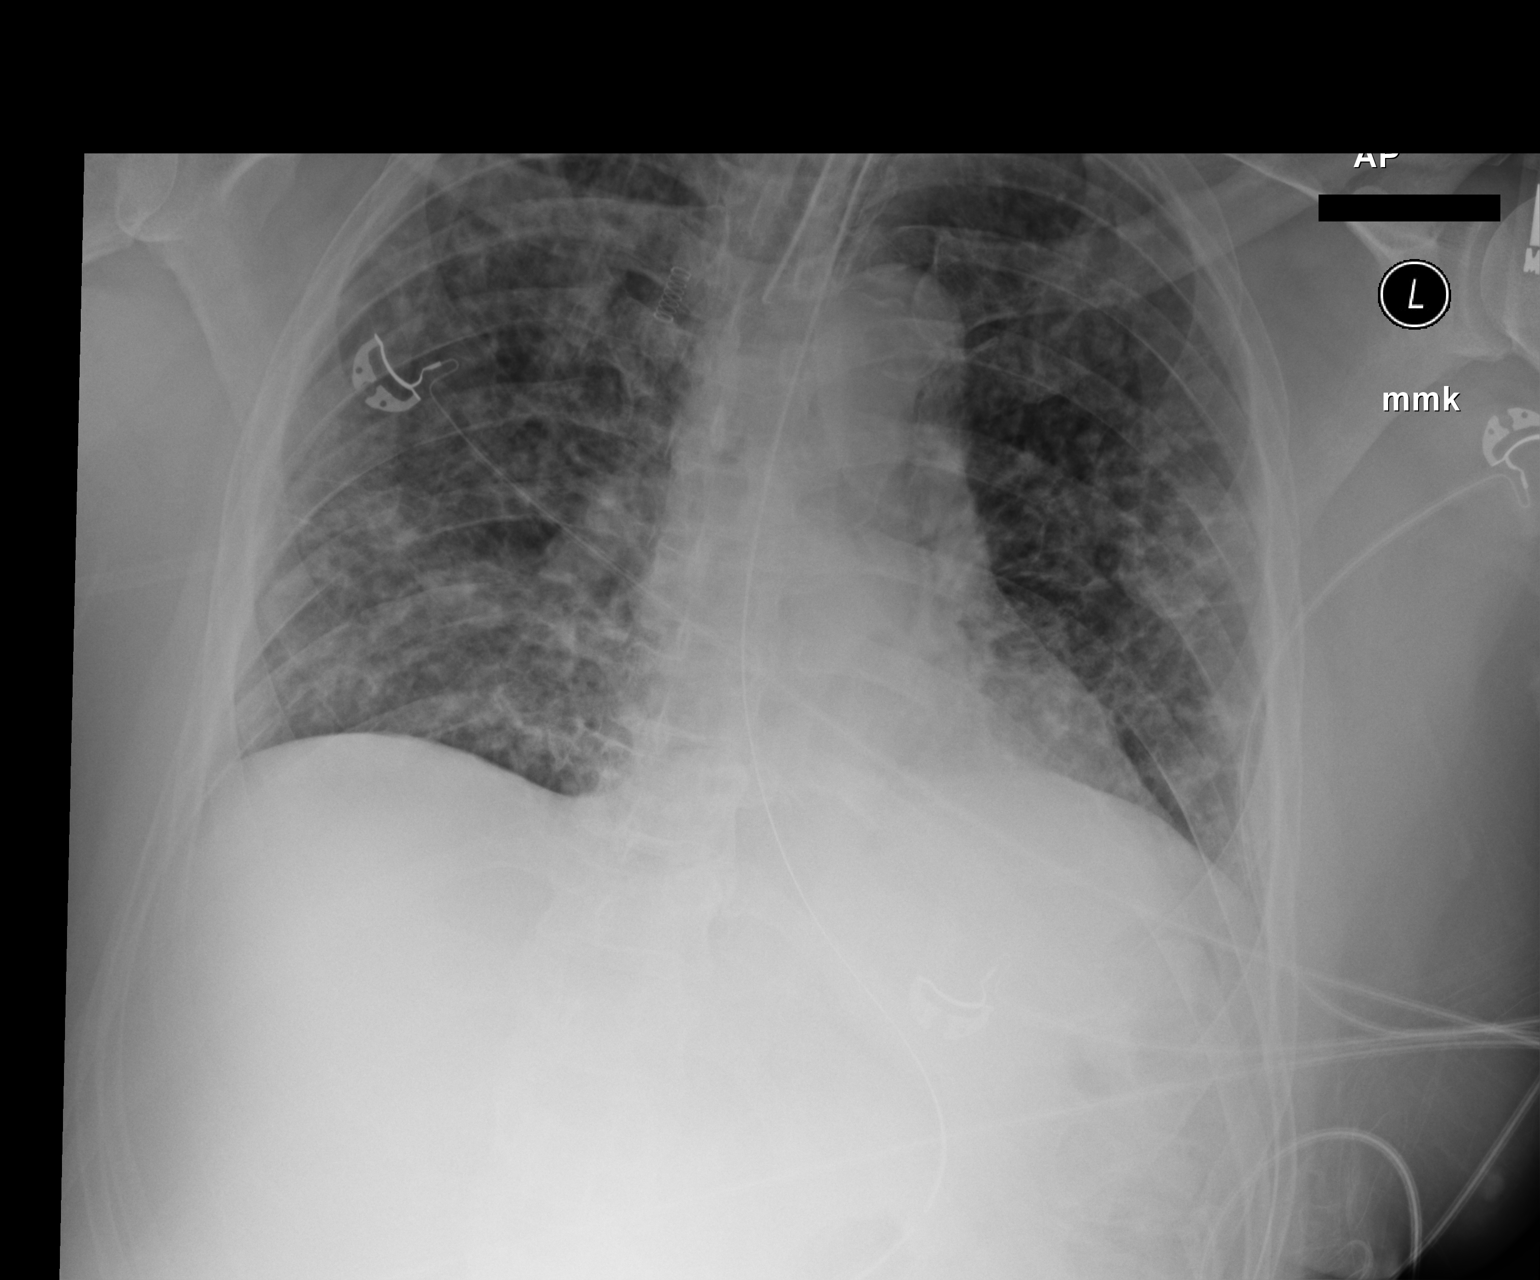

[1 of 1 positions shown; findings below may reference images not displayed]

FINDINGS: Endotracheal tube unchanged. Introduction of NG tube with tip below
the margin of the film. The tube is in the course of the esophagus
and stomach.

Patchy bilateral airspace disease unchanged.
IMPRESSION: NG tube in stomach.
